# Patient Record
Sex: Female | Born: 1990 | Race: Black or African American | Hispanic: No | Marital: Single | Smoking: Never smoker
Health system: Southern US, Community
[De-identification: ages and names within clinical notes are randomized; demographics above are authoritative.]

## PROBLEM LIST (undated history)

## (undated) ENCOUNTER — Inpatient Hospital Stay: Payer: Self-pay

## (undated) DIAGNOSIS — G43909 Migraine, unspecified, not intractable, without status migrainosus: Secondary | ICD-10-CM

## (undated) DIAGNOSIS — I4430 Unspecified atrioventricular block: Secondary | ICD-10-CM

## (undated) HISTORY — PX: WISDOM TOOTH EXTRACTION: SHX21

---

## 2013-07-12 ENCOUNTER — Emergency Department (HOSPITAL_COMMUNITY): Payer: Self-pay

## 2013-07-12 ENCOUNTER — Encounter (HOSPITAL_COMMUNITY): Payer: Self-pay | Admitting: Emergency Medicine

## 2013-07-12 ENCOUNTER — Emergency Department (HOSPITAL_COMMUNITY)
Admission: EM | Admit: 2013-07-12 | Discharge: 2013-07-12 | Disposition: A | Discharge status: Home / Self Care | Payer: Self-pay | Attending: Emergency Medicine | Admitting: Emergency Medicine

## 2013-07-12 DIAGNOSIS — R109 Unspecified abdominal pain: Secondary | ICD-10-CM | POA: Insufficient documentation

## 2013-07-12 DIAGNOSIS — H53459 Other localized visual field defect, unspecified eye: Secondary | ICD-10-CM | POA: Insufficient documentation

## 2013-07-12 DIAGNOSIS — R350 Frequency of micturition: Secondary | ICD-10-CM | POA: Insufficient documentation

## 2013-07-12 DIAGNOSIS — O219 Vomiting of pregnancy, unspecified: Secondary | ICD-10-CM | POA: Insufficient documentation

## 2013-07-12 DIAGNOSIS — N390 Urinary tract infection, site not specified: Secondary | ICD-10-CM

## 2013-07-12 DIAGNOSIS — O9989 Other specified diseases and conditions complicating pregnancy, childbirth and the puerperium: Secondary | ICD-10-CM | POA: Insufficient documentation

## 2013-07-12 DIAGNOSIS — R197 Diarrhea, unspecified: Secondary | ICD-10-CM | POA: Insufficient documentation

## 2013-07-12 DIAGNOSIS — Z3490 Encounter for supervision of normal pregnancy, unspecified, unspecified trimester: Secondary | ICD-10-CM

## 2013-07-12 DIAGNOSIS — Z3201 Encounter for pregnancy test, result positive: Secondary | ICD-10-CM | POA: Insufficient documentation

## 2013-07-12 DIAGNOSIS — R110 Nausea: Secondary | ICD-10-CM | POA: Insufficient documentation

## 2013-07-12 DIAGNOSIS — R51 Headache: Secondary | ICD-10-CM | POA: Insufficient documentation

## 2013-07-12 DIAGNOSIS — R519 Headache, unspecified: Secondary | ICD-10-CM

## 2013-07-12 DIAGNOSIS — O2390 Unspecified genitourinary tract infection in pregnancy, unspecified trimester: Secondary | ICD-10-CM | POA: Insufficient documentation

## 2013-07-12 HISTORY — DX: Migraine, unspecified, not intractable, without status migrainosus: G43.909

## 2013-07-12 LAB — CBC WITH DIFFERENTIAL/PLATELET
Basophils Absolute: 0 10*3/uL (ref 0.0–0.1)
Basophils Relative: 0 % (ref 0–1)
Eosinophils Absolute: 0 10*3/uL (ref 0.0–0.7)
Lymphocytes Relative: 36 % (ref 12–46)
MCH: 24.8 pg — ABNORMAL LOW (ref 26.0–34.0)
MCHC: 34.2 g/dL (ref 30.0–36.0)
Monocytes Absolute: 0.4 10*3/uL (ref 0.1–1.0)
Neutro Abs: 3.2 10*3/uL (ref 1.7–7.7)
Neutrophils Relative %: 57 % (ref 43–77)
RDW: 14 % (ref 11.5–15.5)

## 2013-07-12 LAB — URINALYSIS, ROUTINE W REFLEX MICROSCOPIC
Ketones, ur: >80 mg/dL — AB
Protein, ur: NEGATIVE mg/dL
Urobilinogen, UA: 1 mg/dL (ref 0.0–1.0)

## 2013-07-12 LAB — URINE MICROSCOPIC-ADD ON

## 2013-07-12 LAB — BASIC METABOLIC PANEL
BUN: 7 mg/dL (ref 6–23)
Calcium: 9.6 mg/dL (ref 8.4–10.5)
GFR calc Af Amer: >90 mL/min (ref >90)
GFR calc non Af Amer: >90 mL/min (ref >90)
Glucose, Bld: 69 mg/dL — ABNORMAL LOW (ref 70–99)
Potassium: 3.1 mEq/L — ABNORMAL LOW (ref 3.5–5.1)
Sodium: 136 mEq/L (ref 135–145)

## 2013-07-12 LAB — WET PREP, GENITAL: Trich, Wet Prep: NONE SEEN

## 2013-07-12 LAB — POCT PREGNANCY, URINE: Preg Test, Ur: POSITIVE — AB

## 2013-07-12 MED ORDER — FENTANYL CITRATE 0.05 MG/ML IJ SOLN: 50.0000 ug | Freq: Once | INTRAMUSCULAR | Status: DC

## 2013-07-12 MED ORDER — SODIUM CHLORIDE 0.9 % IV BOLUS (SEPSIS)
1000.0000 mL | Freq: Once | INTRAVENOUS | Status: AC
  Administered 2013-07-12: 1000 mL via INTRAVENOUS

## 2013-07-12 MED ORDER — ONDANSETRON HCL 4 MG PO TABS: 4.0000 mg | ORAL_TABLET | Freq: Three times a day (TID) | ORAL | Status: DC | PRN

## 2013-07-12 MED ORDER — METOCLOPRAMIDE HCL 5 MG/ML IJ SOLN
10.0000 mg | Freq: Once | INTRAMUSCULAR | Status: DC
  Filled 2013-07-12: qty 2

## 2013-07-12 MED ORDER — ONDANSETRON HCL 4 MG/2ML IJ SOLN
4.0000 mg | Freq: Once | INTRAMUSCULAR | Status: AC
  Administered 2013-07-12: 4 mg via INTRAVENOUS
  Filled 2013-07-12: qty 2

## 2013-07-12 MED ORDER — DEXAMETHASONE SODIUM PHOSPHATE 10 MG/ML IJ SOLN
10.0000 mg | Freq: Once | INTRAMUSCULAR | Status: DC
  Filled 2013-07-12: qty 1

## 2013-07-12 MED ORDER — ONDANSETRON HCL 4 MG/2ML IJ SOLN
4.0000 mg | Freq: Once | INTRAMUSCULAR | Status: DC
  Filled 2013-07-12: qty 2

## 2013-07-12 MED ORDER — FENTANYL CITRATE 0.05 MG/ML IJ SOLN
50.0000 ug | Freq: Once | INTRAMUSCULAR | Status: AC
  Administered 2013-07-12: 50 ug via INTRAVENOUS
  Filled 2013-07-12: qty 2

## 2013-07-12 MED ORDER — CEPHALEXIN 500 MG PO CAPS: 500.0000 mg | ORAL_CAPSULE | Freq: Four times a day (QID) | ORAL | Status: DC

## 2013-07-12 NOTE — ED Provider Notes (Signed)
CSN: [id number]     Arrival date & time 07/12/13  0735 History     First MD Initiated Contact with Patient 07/12/13 [phone]     Chief Complaint  Patient presents with  . Headache  . Nausea   (Consider location/radiation/quality/duration/timing/severity/associated sxs/prior Treatment) Patient is a 22 y.o. female presenting with headaches.  Headache  Pt with history of migraines reports 3 days of diffuse throbbing headache, nausea, vomiting and diarrhea. Unsure if headache or vomiting started first. Symptoms were not sudden in onset and similar to previous migraines. She states during the night she also began having scotoma. No fever no neck stiffness. She reports some increased urinary frequency but no dysuria or hematuria. She has had a mild R pelvic discomfort ongoing for about a month, does not think she is pregnant, but has irregular menses. No vaginal bleeding or discharge.   No past medical history on file. No past surgical history on file. No family history on file. History  Substance Use Topics  . Smoking status: Not on file  . Smokeless tobacco: Not on file  . Alcohol Use: Not on file   OB History   No data available     Review of Systems  Neurological: Positive for headaches.   All other systems reviewed and are negative except as noted in HPI.   Allergies  Review of patient's allergies indicates not on file.  Home Medications  No current outpatient prescriptions on file. BP 143/87  Pulse 79  Temp(Src) 98.3 F (36.8 C) (Oral)  Resp 16  SpO2 100% Physical Exam  Nursing note and vitals reviewed. Constitutional: She is oriented to person, place, and time. She appears well-developed and well-nourished.  HENT:  Head: Normocephalic and atraumatic.  Eyes: EOM are normal. Pupils are equal, round, and reactive to light.  Neck: Normal range of motion. Neck supple.  Cardiovascular: Normal rate, normal heart sounds and intact distal pulses.   Pulmonary/Chest: Effort  normal and breath sounds normal.  Abdominal: Bowel sounds are normal. She exhibits no distension. There is tenderness (mild RLQ/pelvis tenderness). There is no rebound and no guarding.  Musculoskeletal: Normal range of motion. She exhibits no edema and no tenderness.  Neurological: She is alert and oriented to person, place, and time. She has normal strength. No cranial nerve deficit or sensory deficit.  Skin: Skin is warm and dry. No rash noted.  Psychiatric: She has a normal mood and affect.    ED Course   Procedures (including critical care time)  Labs Reviewed  WET PREP, GENITAL - Abnormal; Notable for the following:    Clue Cells Wet Prep HPF POC FEW (*)    WBC, Wet Prep HPF POC FEW (*)    All other components within normal limits  URINALYSIS, ROUTINE W REFLEX MICROSCOPIC - Abnormal; Notable for the following:    APPearance HAZY (*)    Ketones, ur >80 (*)    Leukocytes, UA MODERATE (*)    All other components within normal limits  CBC WITH DIFFERENTIAL - Abnormal; Notable for the following:    Hemoglobin 11.7 (*)    HCT 34.2 (*)    MCV 72.5 (*)    MCH 24.8 (*)    All other components within normal limits  BASIC METABOLIC PANEL - Abnormal; Notable for the following:    Potassium 3.1 (*)    Glucose, Bld 69 (*)    All other components within normal limits  URINE MICROSCOPIC-ADD ON - Abnormal; Notable for the following:  Squamous Epithelial / LPF FEW (*)    Bacteria, UA FEW (*)    All other components within normal limits  POCT PREGNANCY, URINE - Abnormal; Notable for the following:    Preg Test, Ur POSITIVE (*)    All other components within normal limits  GC/CHLAMYDIA PROBE AMP  URINE CULTURE  HCG, QUANTITATIVE, PREGNANCY   US Ob Transvaginal  07/12/2013   *RADIOLOGY REPORT*  Clinical Data:  Pregnancy.  Right pelvic pain.  By LMP, the patient would be 10 weeks 6 days.  EDC by LMP would be 02/01/2014.  LIMITED OBSTETRIC ULTRASOUND  Number of Fetuses: 1 Heart Rate:  135bpm Movement:  Present Presentation: Cephalic Placental Location: Fundal Previa: None Amniotic Fluid (Subjective): Normal Vertical pocket:  5.3cm  BPD:  4.14cm   18w    4d       EDC: 12/09/2013  MATERNAL FINDINGS: Cervix:  Not well seen Uterus/Adnexae:  The right ovary is not visualized.  Left ovary is normal appearance, 2.9 x 1.8 x 1.7 cm.  IMPRESSION:  1.  Single living intrauterine fetus in cephalic presentation. 2.  Size by ultrasound correlates with dating of 18 weeks 4 days. EDC by today's exam would be 12/09/2013.  This exam is performed on an emergent basis and does not comprehensively evaluate fetal size, dating, or anatomy; a follow- up complete OB US should be considered if further fetal assessment is warranted.   Original Report Authenticated By: Norva Pavlov, M.D.   1. Pregnancy   2. Headache   3. UTI (urinary tract infection)     MDM  Pt with positive pregnancy. No previous pregnancies, no prior STD treatment. Pelvic exam: mild white discharge, unable to tolerate speculum exam, bimanual tender on R adnexa. Send for [place] to rule out ectopic.   10:39 AM HCG quant sent to [facility] for analysis however US shows [redacted]wk gestation IUP. UA concerning for UTI, will start on Keflex, will need close Ob follow-up to establish for prenatal care. HA and nausea resolved.   Charles B. Bernette Mayers, MD 07/12/13 1042

## 2013-07-12 NOTE — ED Notes (Signed)
Pt returned to the ED from US

## 2013-07-12 NOTE — ED Notes (Signed)
MD at bedside. 

## 2013-07-12 NOTE — ED Notes (Signed)
Patient transported to Ultrasound 

## 2013-07-12 NOTE — ED Notes (Signed)
Headache (pain: 9/10) started 2 days ago and has persisted.  Pt says that normally headaches usually go away by now.  Pt has been nauseated for the same amount of time and also complains of visual changes such as seeing spots and squiggly things.  C/o RLQ pain (pain 6/10) that has been intermittent and going on for about a month.

## 2013-07-12 NOTE — ED Notes (Signed)
MD at bedside performing pelvic exam.

## 2013-07-13 LAB — GC/CHLAMYDIA PROBE AMP
CT Probe RNA: POSITIVE — AB
GC Probe RNA: NEGATIVE

## 2013-07-14 LAB — URINE CULTURE

## 2013-07-14 NOTE — ED Notes (Signed)
Chart sent to EDP office for review.+ Chlamydia 

## 2013-07-15 ENCOUNTER — Telehealth (HOSPITAL_COMMUNITY): Payer: Self-pay | Admitting: *Deleted

## 2013-07-15 NOTE — ED Notes (Signed)
Chart returned from EDP office .With rx written for Azithromycin 1 gram po x 1 dose need to be called to pharmacy.

## 2013-07-16 ENCOUNTER — Telehealth (HOSPITAL_COMMUNITY): Payer: Self-pay | Admitting: Emergency Medicine

## 2013-07-16 NOTE — ED Notes (Signed)
Post ED Visit - Positive Culture Follow-up ° °Culture report reviewed by antimicrobial stewardship pharmacist: °[] Wes Dulaney, Pharm.D., BCPS °[x] Jeremy Frens, Pharm.D., BCPS °[] Elizabeth Martin, Pharm.D., BCPS °[] Minh Pham, Pharm.D., BCPS, AAHIVP °[] Michelle Turner, Pharm.D., BCPS, AAHIVP ° °Positive urine culture °Treated with Keflex, organism sensitive to the same and no further patient follow-up is required at this time. ° °Lindsey Morgan °07/16/2013, 10:52 AM ° ° °

## 2014-12-01 NOTE — L&D Delivery Note (Addendum)
Obstetrical Delivery Note   Date of Delivery:   04/28/2015 Primary OB:   [facility] Gestational Age/EDD: 40/4 weeks (dated by 21 week ultrasound) Antepartum complications: [place] trait, only two prenatal visits, +gonorrhea  Delivered By:   Cornelia Copaharlie Juleen Sorrels, Jr MD  Delivery Type:   spontaneous vaginal delivery  Procedure Details:   CTSP for crowning. Over one UC, patient delivered infant, DOA, without issue. Delayed CC and cord cut by FOB. Placenta out via active third stage (intact). Perineum intact with one small divet that was bleeding and fixed with 3-0 figure of eight vicryl suture x 1.  Anesthesia:    epidural Intrapartum complications: None GBS:    Negative Laceration:    none Episiotomy:    none Placenta:    Via active 3rd stage. Intact: yes. To pathology: no.  Estimated Blood Loss:  250mL  Baby:    Liveborn female, Apgars 8/9, weight 2829gm  Cornelia Copaharlie Wayde Gopaul, Jr. MD [facility] Pager [phone]

## 2014-12-13 ENCOUNTER — Emergency Department (HOSPITAL_COMMUNITY)
Admission: EM | Admit: 2014-12-13 | Discharge: 2014-12-13 | Disposition: A | Discharge status: Home / Self Care | Payer: Self-pay | Attending: Emergency Medicine | Admitting: Emergency Medicine

## 2014-12-13 ENCOUNTER — Encounter (HOSPITAL_COMMUNITY): Payer: Self-pay | Admitting: *Deleted

## 2014-12-13 ENCOUNTER — Emergency Department (HOSPITAL_COMMUNITY): Payer: Self-pay

## 2014-12-13 DIAGNOSIS — M7989 Other specified soft tissue disorders: Secondary | ICD-10-CM | POA: Insufficient documentation

## 2014-12-13 DIAGNOSIS — O9989 Other specified diseases and conditions complicating pregnancy, childbirth and the puerperium: Secondary | ICD-10-CM | POA: Insufficient documentation

## 2014-12-13 DIAGNOSIS — R42 Dizziness and giddiness: Secondary | ICD-10-CM | POA: Insufficient documentation

## 2014-12-13 DIAGNOSIS — O209 Hemorrhage in early pregnancy, unspecified: Secondary | ICD-10-CM | POA: Insufficient documentation

## 2014-12-13 DIAGNOSIS — Z3A16 16 weeks gestation of pregnancy: Secondary | ICD-10-CM | POA: Insufficient documentation

## 2014-12-13 DIAGNOSIS — Z8679 Personal history of other diseases of the circulatory system: Secondary | ICD-10-CM | POA: Insufficient documentation

## 2014-12-13 DIAGNOSIS — N939 Abnormal uterine and vaginal bleeding, unspecified: Secondary | ICD-10-CM

## 2014-12-13 DIAGNOSIS — R51 Headache: Secondary | ICD-10-CM | POA: Insufficient documentation

## 2014-12-13 DIAGNOSIS — Z3490 Encounter for supervision of normal pregnancy, unspecified, unspecified trimester: Secondary | ICD-10-CM

## 2014-12-13 LAB — CBC WITH DIFFERENTIAL/PLATELET
BASOS PCT: 0 % (ref 0–1)
Basophils Absolute: 0 10*3/uL (ref 0.0–0.1)
EOS ABS: 0.1 10*3/uL (ref 0.0–0.7)
EOS PCT: 2 % (ref 0–5)
HEMATOCRIT: 30.8 % — AB (ref 36.0–46.0)
Hemoglobin: 10.6 g/dL — ABNORMAL LOW (ref 12.0–15.0)
LYMPHS PCT: 37 % (ref 12–46)
Lymphs Abs: 1.7 10*3/uL (ref 0.7–4.0)
MCH: 25.5 pg — ABNORMAL LOW (ref 26.0–34.0)
MCHC: 34.4 g/dL (ref 30.0–36.0)
MCV: 74.2 fL — AB (ref 78.0–100.0)
MONO ABS: 0.3 10*3/uL (ref 0.1–1.0)
MONOS PCT: 7 % (ref 3–12)
NEUTROS ABS: 2.6 10*3/uL (ref 1.7–7.7)
Neutrophils Relative %: 54 % (ref 43–77)
PLATELETS: 190 10*3/uL (ref 150–400)
RBC: 4.15 MIL/uL (ref 3.87–5.11)
RDW: 13.9 % (ref 11.5–15.5)
WBC: 4.7 10*3/uL (ref 4.0–10.5)

## 2014-12-13 LAB — BASIC METABOLIC PANEL
ANION GAP: 8 (ref 5–15)
BUN: 6 mg/dL (ref 6–23)
CALCIUM: 8.4 mg/dL (ref 8.4–10.5)
CHLORIDE: 103 meq/L (ref 96–112)
CO2: 23 mmol/L (ref 19–32)
CREATININE: 0.67 mg/dL (ref 0.50–1.10)
GFR calc non Af Amer: >90 mL/min (ref >90)
Glucose, Bld: 76 mg/dL (ref 70–99)
Potassium: 3.5 mmol/L (ref 3.5–5.1)
SODIUM: 134 mmol/L — AB (ref 135–145)

## 2014-12-13 LAB — ABO/RH: ABO/RH(D): O POS

## 2014-12-13 LAB — PREGNANCY, URINE: PREG TEST UR: POSITIVE — AB

## 2014-12-13 LAB — HCG, QUANTITATIVE, PREGNANCY: HCG, BETA CHAIN, QUANT, S: 22428 m[IU]/mL — AB (ref <5)

## 2014-12-13 MED ORDER — PRENATAL VITAMINS 28-0.8 MG PO TABS: 1.0000 | ORAL_TABLET | Freq: Every day | ORAL | Status: DC

## 2014-12-13 NOTE — ED Provider Notes (Addendum)
CSN: [id number]     Arrival date & time 12/13/14  6578 History   First MD Initiated Contact with Patient 12/13/14 [phone]     Chief Complaint  Patient presents with  . Vaginal Bleeding  . Headache     (Consider location/radiation/quality/duration/timing/severity/associated sxs/prior Treatment) Patient is a 24 y.o. female presenting with vaginal bleeding and headaches. The history is provided by the patient.  Vaginal Bleeding Associated symptoms: abdominal pain and dizziness   Associated symptoms: no back pain, no dysuria and no fever   Headache Associated symptoms: abdominal pain and dizziness   Associated symptoms: no back pain, no congestion and no fever    patient with onset of vaginal bleeding last night. Has been heavy at times. Patient's last menstrual period was 08/28/2014 however she was on Depo-Provera until August. Patient also with complaint of some swelling to both legs and hands making her suspicious that she may be pregnant. Patient with mild headache and dizziness. The vaginal bleeding associated with some mild abdominal cramping. Patient's in no acute distress.  Past Medical History  Diagnosis Date  . Migraines    History reviewed. No pertinent past surgical history. History reviewed. No pertinent family history. History  Substance Use Topics  . Smoking status: Never Smoker   . Smokeless tobacco: Not on file  . Alcohol Use: No   OB History    No data available     Review of Systems  Constitutional: Negative for fever.  HENT: Negative for congestion.   Eyes: Negative for redness.  Respiratory: Negative for shortness of breath.   Cardiovascular: Positive for leg swelling. Negative for chest pain.  Gastrointestinal: Positive for abdominal pain.  Genitourinary: Positive for vaginal bleeding. Negative for dysuria.  Musculoskeletal: Negative for back pain.  Skin: Negative for rash.  Allergic/Immunologic: Negative for immunocompromised state.  Neurological:  Positive for dizziness and headaches.  Hematological: Does not bruise/bleed easily.  Psychiatric/Behavioral: Negative for confusion.      Allergies  Review of patient's allergies indicates no known allergies.  Home Medications   Prior to Admission medications   Medication Sig Start Date End Date Taking? Authorizing Provider  ibuprofen (ADVIL,MOTRIN) 400 MG tablet Take 800 mg by mouth every 6 (six) hours as needed for mild pain.   Yes Historical Provider, MD  cephALEXin (KEFLEX) 500 MG capsule Take 1 capsule (500 mg total) by mouth 4 (four) times daily. Patient not taking: Reported on 12/13/2014 07/12/13   Charles B. Bernette Mayers, MD  ondansetron (ZOFRAN) 4 MG tablet Take 1 tablet (4 mg total) by mouth every 8 (eight) hours as needed for nausea. Patient not taking: Reported on 12/13/2014 07/12/13   Charles B. Bernette Mayers, MD  Prenatal Vit-Fe Fumarate-FA (PRENATAL VITAMINS) 28-0.8 MG TABS Take 1 tablet by mouth daily. 12/13/14   Vanetta Mulders, MD   BP 114/48 mmHg  Pulse 85  Temp(Src) 98.3 F (36.8 C) (Oral)  Resp 16  SpO2 99%  LMP 08/01/2014 Physical Exam  Constitutional: She is oriented to person, place, and time. She appears well-developed and well-nourished. No distress.  HENT:  Head: Normocephalic and atraumatic.  Mouth/Throat: Oropharynx is clear and moist.  Eyes: Conjunctivae and EOM are normal. Pupils are equal, round, and reactive to light.  Neck: Normal range of motion.  Cardiovascular: Normal rate, regular rhythm and normal heart sounds.   Pulmonary/Chest: Effort normal and breath sounds normal.  Abdominal: Soft. Bowel sounds are normal. There is no tenderness.  Questionable fullness to the uterus.  Musculoskeletal: Normal range of motion.  Neurological: She is alert and oriented to person, place, and time. No cranial nerve deficit. She exhibits normal muscle tone. Coordination normal.  Skin: Skin is warm. No rash noted.  Nursing note and vitals reviewed.   ED Course   Procedures (including critical care time) Labs Review Labs Reviewed  BASIC METABOLIC PANEL - Abnormal; Notable for the following:    Sodium 134 (*)    All other components within normal limits  CBC WITH DIFFERENTIAL - Abnormal; Notable for the following:    Hemoglobin 10.6 (*)    HCT 30.8 (*)    MCV 74.2 (*)    MCH 25.5 (*)    All other components within normal limits  PREGNANCY, URINE - Abnormal; Notable for the following:    Preg Test, Ur POSITIVE (*)    All other components within normal limits  HCG, QUANTITATIVE, PREGNANCY - Abnormal; Notable for the following:    hCG, Beta Chain, Quant, S 22428 (*)    All other components within normal limits  ABO/RH   Results for orders placed or performed during the hospital encounter of 12/13/14  Basic metabolic panel  Result Value Ref Range   Sodium 134 (L) 135 - 145 mmol/L   Potassium 3.5 3.5 - 5.1 mmol/L   Chloride 103 96 - 112 mEq/L   CO2 23 19 - 32 mmol/L   Glucose, Bld 76 70 - 99 mg/dL   BUN 6 6 - 23 mg/dL   Creatinine, Ser 4.09 0.50 - 1.10 mg/dL   Calcium 8.4 8.4 - 81.1 mg/dL   GFR calc non Af Amer >90 >90 mL/min   GFR calc Af Amer >90 >90 mL/min   Anion gap 8 5 - 15  CBC with Differential  Result Value Ref Range   WBC 4.7 4.0 - 10.5 K/uL   RBC 4.15 3.87 - 5.11 MIL/uL   Hemoglobin 10.6 (L) 12.0 - 15.0 g/dL   HCT 91.4 (L) 78.2 - 95.6 %   MCV 74.2 (L) 78.0 - 100.0 fL   MCH 25.5 (L) 26.0 - 34.0 pg   MCHC 34.4 30.0 - 36.0 g/dL   RDW 21.3 08.6 - 57.8 %   Platelets 190 150 - 400 K/uL   Neutrophils Relative % 54 43 - 77 %   Lymphocytes Relative 37 12 - 46 %   Monocytes Relative 7 3 - 12 %   Eosinophils Relative 2 0 - 5 %   Basophils Relative 0 0 - 1 %   Neutro Abs 2.6 1.7 - 7.7 K/uL   Lymphs Abs 1.7 0.7 - 4.0 K/uL   Monocytes Absolute 0.3 0.1 - 1.0 K/uL   Eosinophils Absolute 0.1 0.0 - 0.7 K/uL   Basophils Absolute 0.0 0.0 - 0.1 K/uL   Smear Review MORPHOLOGY UNREMARKABLE   Pregnancy, urine  Result Value Ref Range    Preg Test, Ur POSITIVE (A) NEGATIVE  hCG, quantitative, pregnancy  Result Value Ref Range   hCG, Beta Chain, Quant, S 22428 (H) <5 mIU/mL  ABO/Rh  Result Value Ref Range   ABO/RH(D) O POS    No rh immune globuloin NOT A RH IMMUNE GLOBULIN CANDIDATE, PT RH POSITIVE      Imaging Review US Ob Limited  12/13/2014   CLINICAL DATA:  Pregnant patient with vaginal bleeding.  EXAM: LIMITED OBSTETRIC ULTRASOUND  FINDINGS: Number of Fetuses: 1  Heart Rate:  121 bpm  Movement: Yes  Presentation: Cephalic  Placental Location: Posterior  Previa: No  Amniotic Fluid (Subjective):  Within normal  limits.  BPD:  5.0cm 21w  1d  MATERNAL FINDINGS:  Cervix:  Appears closed.  Uterus/Adnexae:  No abnormality visualized.  IMPRESSION: Single living anterior pregnancy.  No acute abnormality.  This exam is performed on an emergent basis and does not comprehensively evaluate fetal size, dating, or anatomy; follow-up complete OB US should be considered if further fetal assessment is warranted.   Electronically Signed   By: Drusilla Kannerhomas  Dalessio M.D.   On: 12/13/2014 11:03     EKG Interpretation None      MDM   Final diagnoses:  Vaginal bleeding  Pregnancy    Patient with positive pregnancy test. Last menstrual period was 08/28/2014 however patient had been on Depakote that was stopped in August. So dates are not very clear. Patient started with vaginal bleeding last night. The patient was suspicious that she possibly could be pregnant. Patient's a gravida 2 based on today's pregnancy para 0.  Quantitative hCG and ultrasound have been ordered to further evaluate the pregnancy status.  Rh factor is positive so no concerns for sensitization. Ultrasound is pending. Quantitative hCG is pending.  Ultrasound is positive for uterine pregnancy with fetal heart tones and fetal motion. Wearing ultrasound patient fairly far along. Quantitative hCG still pending. Patient be started on prenatal vitamins and referred to OB/GYN for  follow-up. Patient still understands that there is a possibility of miscarriage since been some bleeding but is always of concern. Quantitative hCG is back and can be used for comparison of a repeat hCG in the next 48 hours.  Vanetta MuldersScott Nephi Savage, MD 12/13/14 1125  Vanetta MuldersScott Danice Dippolito, MD 12/13/14 [phone]

## 2014-12-13 NOTE — Discharge Instructions (Signed)
Start taking the prenatal vitamins 1 a day. Return for any new or worse symptoms to include worse abdominal pain worse bleeding. Make an appointment to follow-up with OB/GYN. Call [phone].

## 2014-12-13 NOTE — ED Notes (Addendum)
Pt reports having a headache, dizziness, leg swelling and thinks she could possibly be pregnant but started having vaginal bleeding last night. lmp was sept, took one preg test which was negative.

## 2014-12-13 NOTE — ED Notes (Signed)
NAD at this time pt is stable and going home.

## 2015-03-16 ENCOUNTER — Emergency Department: Admit: 2015-03-16 | Disposition: A | Discharge status: Home / Self Care | Payer: Self-pay | Admitting: Student

## 2015-03-16 LAB — CBC
HCT: 32 % — ABNORMAL LOW (ref 35.0–47.0)
HGB: 10.7 g/dL — ABNORMAL LOW (ref 12.0–16.0)
MCH: 25.2 pg — ABNORMAL LOW (ref 26.0–34.0)
MCHC: 33.4 g/dL (ref 32.0–36.0)
MCV: 76 fL — ABNORMAL LOW (ref 80–100)
PLATELETS: 183 10*3/uL (ref 150–440)
RBC: 4.24 10*6/uL (ref 3.80–5.20)
RDW: 13.6 % (ref 11.5–14.5)
WBC: 10 10*3/uL (ref 3.6–11.0)

## 2015-03-16 LAB — COMPREHENSIVE METABOLIC PANEL
ALK PHOS: 63 U/L
ALT: 13 U/L — AB
AST: 23 U/L
Albumin: 2.5 g/dL — ABNORMAL LOW
Anion Gap: 5 — ABNORMAL LOW (ref 7–16)
BUN: 6 mg/dL
Bilirubin,Total: 1.1 mg/dL
CO2: 21 mmol/L — AB
Calcium, Total: 7.8 mg/dL — ABNORMAL LOW
Chloride: 104 mmol/L
Creatinine: 0.78 mg/dL
EGFR (African American): >60
Glucose: 91 mg/dL
POTASSIUM: 3.3 mmol/L — AB
SODIUM: 130 mmol/L — AB
Total Protein: 5.7 g/dL — ABNORMAL LOW

## 2015-03-16 LAB — PROTIME-INR
INR: 1
Prothrombin Time: 13.4 secs

## 2015-03-16 LAB — APTT: Activated PTT: 32.7 secs (ref 23.6–35.9)

## 2015-03-16 LAB — CK TOTAL AND CKMB (NOT AT ARMC)
CK, Total: 86 U/L
CK-MB: 0.8 ng/mL

## 2015-03-16 LAB — TROPONIN I: Troponin-I: <0.03 ng/mL

## 2015-03-17 LAB — URINALYSIS, COMPLETE
BILIRUBIN, UR: NEGATIVE
GLUCOSE, UR: NEGATIVE mg/dL (ref 0–75)
Nitrite: NEGATIVE
Ph: 8 (ref 4.5–8.0)
Protein: 30
SPECIFIC GRAVITY: 1.008 (ref 1.003–1.030)

## 2015-03-20 LAB — OB RESULTS CONSOLE ABO/RH: RH Type: NEGATIVE

## 2015-04-01 NOTE — Consult Note (Signed)
Consulting Department: Emergency Department Consulting Physician: Janalyn Harderavid Kaminski MD  Consulting Question: Pregnant no prenatal care  History of Present Illness: Lindsey Morgan 24 year old G1 at approximately 31 weeks based on ultrasound 2 weeks ago at [place].  She has had one other [facility] around 20 [facility].  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  She is unsure of her LMP as she conceived on Depo Provera, last shot June or May 2015, this is also the reason for her late presentation.  She presents to the ER today with subjective dyspnea, was oxygenating well but was tachycardic on presentation.  Work up negative for PE, MI.  She was noted to have a UTI which she will be discharge with macrobid.  Feels better after receiving some fluids here in the ER. history of varicellain healthcare setting so possible TB exposure in past but negative testingcatsclose contact with children family history of birth defectsfamily history of genetic diseases including TaySachs, SMA, huntington's, muscular dystrophy, cystic fibrosis.  She does believe she is a sickle cell carrierfamily history of autism or mental retardationfrench canadian, jewish, middle [place], or asian ancestry  Review of systems: 10 point review of systems negative unless otherwise noted in HPI  Past Medical History: none  Past Surgical History: none  Obstetric History: G1  Past Gynecologic History: no history of abnormal paps, history of uncomplicated chlamydia cervicitis in the past  Family History: non-contributory  Social History:  Denies tobacco, EtOH, or illicit drug use  Allergies: NKDA  Medications:  OTC PNV  Phsyical Exam:99.6, BP 144/63, HR 138, RR 20, O2sat 98% RA NADNormocephalic anictericno thryomegaly or massesRRRCTABGravid, soft, mildly tender suprapubiclly Deferredno edemaA&Ox3, no gross neurological deficits  Labs: CBC, CMP, cardiac enzymes, and  UA reviewed.  Imaging: negative PE CT  Assessment: 24 year old G1 at approximately 31 weeks no prenatal care and UTI  PlanReceived one dose of ciprofloxacin in ER (experimental animal models show effect on fetal cartilage but not adverse effects documented during human pregnancy), discharged on macrobidwill arrange follow up with myself on 03/19/15 at 0800 [place] officecontinue prental vitaminswill set up anatomy scan, 1-hr OGTT in the next week at Mondays visit  Electronic Signatures: Lorrene ReidStaebler, Raylin Winer M (MD)  (Signed on 16-Apr-16 02:59)  Authored  Last Updated: 16-Apr-16 02:59 by Lorrene ReidStaebler, Saksham Akkerman M (MD)

## 2015-04-02 LAB — OB RESULTS CONSOLE GBS
GBS: NEGATIVE
GBS: NEGATIVE

## 2015-04-27 ENCOUNTER — Inpatient Hospital Stay: Payer: 59 | Admitting: Anesthesiology

## 2015-04-27 ENCOUNTER — Inpatient Hospital Stay
Admission: EM | Admit: 2015-04-27 | Discharge: 2015-04-29 | DRG: 951 | Disposition: A | Discharge status: Home / Self Care | Payer: 59 | Attending: Obstetrics and Gynecology | Admitting: Obstetrics and Gynecology

## 2015-04-27 DIAGNOSIS — Z79899 Other long term (current) drug therapy: Secondary | ICD-10-CM | POA: Diagnosis present

## 2015-04-27 DIAGNOSIS — O0933 Supervision of pregnancy with insufficient antenatal care, third trimester: Secondary | ICD-10-CM | POA: Diagnosis present

## 2015-04-27 DIAGNOSIS — Z3493 Encounter for supervision of normal pregnancy, unspecified, third trimester: Secondary | ICD-10-CM

## 2015-04-27 DIAGNOSIS — Z3A40 40 weeks gestation of pregnancy: Secondary | ICD-10-CM | POA: Diagnosis present

## 2015-04-27 LAB — CBC
HCT: 36.7 % (ref 35.0–47.0)
HEMOGLOBIN: 11.8 g/dL — AB (ref 12.0–16.0)
MCH: 23.9 pg — ABNORMAL LOW (ref 26.0–34.0)
MCHC: 32.2 g/dL (ref 32.0–36.0)
MCV: 74.3 fL — AB (ref 80.0–100.0)
PLATELETS: 232 10*3/uL (ref 150–440)
RBC: 4.94 MIL/uL (ref 3.80–5.20)
RDW: 14.4 % (ref 11.5–14.5)
WBC: 6.1 10*3/uL (ref 3.6–11.0)

## 2015-04-27 LAB — TYPE AND SCREEN
ABO/RH(D): O POS
ANTIBODY SCREEN: NEGATIVE

## 2015-04-27 LAB — URINE DRUG SCREEN, QUALITATIVE (ARMC ONLY)
Amphetamines, Ur Screen: NOT DETECTED
BENZODIAZEPINE, UR SCRN: NOT DETECTED
Barbiturates, Ur Screen: NOT DETECTED
COCAINE METABOLITE, UR ~~LOC~~: NOT DETECTED
Cannabinoid 50 Ng, Ur ~~LOC~~: NOT DETECTED
MDMA (Ecstasy)Ur Screen: NOT DETECTED
Methadone Scn, Ur: NOT DETECTED
Opiate, Ur Screen: NOT DETECTED
Phencyclidine (PCP) Ur S: NOT DETECTED
Tricyclic, Ur Screen: NOT DETECTED

## 2015-04-27 MED ORDER — LIDOCAINE HCL (PF) 1 % IJ SOLN
30.0000 mL | INTRAMUSCULAR | Status: DC | PRN
  Filled 2015-04-27: qty 30

## 2015-04-27 MED ORDER — BUPIVACAINE HCL (PF) 0.25 % IJ SOLN
INTRAMUSCULAR | Status: DC | PRN
  Administered 2015-04-27: 2 mL
  Administered 2015-04-27: 5 mL via PERINEURAL

## 2015-04-27 MED ORDER — FENTANYL CITRATE (PF) 100 MCG/2ML IJ SOLN: 50.0000 ug | INTRAMUSCULAR | Status: DC | PRN

## 2015-04-27 MED ORDER — OXYTOCIN 40 UNITS IN LACTATED RINGERS INFUSION - SIMPLE MED
INTRAVENOUS | Status: AC
  Filled 2015-04-27: qty 1000

## 2015-04-27 MED ORDER — LACTATED RINGERS IV SOLN
INTRAVENOUS | Status: DC
  Administered 2015-04-27 (×2): via INTRAVENOUS

## 2015-04-27 MED ORDER — ONDANSETRON HCL 4 MG/2ML IJ SOLN: 4.0000 mg | Freq: Four times a day (QID) | INTRAMUSCULAR | Status: DC | PRN

## 2015-04-27 MED ORDER — OXYTOCIN BOLUS FROM INFUSION: 500.0000 mL | INTRAVENOUS | Status: DC

## 2015-04-27 MED ORDER — AMMONIA AROMATIC IN INHA
RESPIRATORY_TRACT | Status: AC
  Filled 2015-04-27: qty 10

## 2015-04-27 MED ORDER — LIDOCAINE-EPINEPHRINE (PF) 1.5 %-1:200000 IJ SOLN
INTRAMUSCULAR | Status: DC | PRN
  Administered 2015-04-27: 2 mL via PERINEURAL

## 2015-04-27 MED ORDER — LACTATED RINGERS IV SOLN: 500.0000 mL | INTRAVENOUS | Status: DC | PRN

## 2015-04-27 MED ORDER — OXYTOCIN 40 UNITS IN LACTATED RINGERS INFUSION - SIMPLE MED: 62.5000 mL/h | INTRAVENOUS | Status: DC

## 2015-04-27 MED ORDER — ACETAMINOPHEN 325 MG PO TABS: 650.0000 mg | ORAL_TABLET | ORAL | Status: DC | PRN

## 2015-04-27 MED ORDER — FENTANYL 2.5 MCG/ML W/ROPIVACAINE 0.2% IN NS 100 ML EPIDURAL INFUSION (ARMC-ANES)
EPIDURAL | Status: AC
  Administered 2015-04-27: 9 mL/h via EPIDURAL
  Administered 2015-04-28
  Filled 2015-04-27: qty 100

## 2015-04-27 MED ORDER — MISOPROSTOL 200 MCG PO TABS
ORAL_TABLET | ORAL | Status: AC
  Filled 2015-04-27: qty 4

## 2015-04-27 MED ORDER — LIDOCAINE HCL (PF) 1 % IJ SOLN
INTRAMUSCULAR | Status: AC
  Filled 2015-04-27: qty 30

## 2015-04-27 NOTE — Anesthesia Preprocedure Evaluation (Signed)
Anesthesia Evaluation  Patient identified by MRN, date of birth, ID band Patient awake    Reviewed: Allergy & Precautions, NPO status , Patient's Chart, lab work & pertinent test results  History of Anesthesia Complications Negative for: history of anesthetic complications  Airway Mallampati: I       Dental no notable dental hx. (+) Teeth Intact   Pulmonary neg pulmonary ROS,    Pulmonary exam normal       Cardiovascular negative cardio ROS Normal cardiovascular exam    Neuro/Psych negative neurological ROS  negative psych ROS   GI/Hepatic negative GI ROS, Neg liver ROS,   Endo/Other  negative endocrine ROS  Renal/GU negative Renal ROS  negative genitourinary   Musculoskeletal negative musculoskeletal ROS (+)   Abdominal Normal abdominal exam  (+)   Peds negative pediatric ROS (+)  Hematology negative hematology ROS (+)   Anesthesia Other Findings   Reproductive/Obstetrics negative OB ROS                             Anesthesia Physical Anesthesia Plan  ASA: II  Anesthesia Plan: Epidural   Post-op Pain Management:    Induction:   Airway Management Planned:   Additional Equipment:   Intra-op Plan:   Post-operative Plan:   Informed Consent: I have reviewed the patients History and Physical, chart, labs and discussed the procedure including the risks, benefits and alternatives for the proposed anesthesia with the patient or authorized representative who has indicated his/her understanding and acceptance.     Plan Discussed with:   Anesthesia Plan Comments:         Anesthesia Quick Evaluation

## 2015-04-27 NOTE — Progress Notes (Signed)
Pt has been consented.  Sitting up at bedside. Dr. Sherron MondayVanstaferen at bedside.

## 2015-04-27 NOTE — H&P (Addendum)
Obstetrics Admission History & Physical  Primary OBGYN: [facility]  Chief Complaint: regular UCs  History of Present Illness  24 y.o. [id number] @ 40/3 (EDC 5/24, dated by 21wk u/s). Pregnancy complicated by: only two PNVs (4/18 and 5/2), [place] trait, +GC (treated on 5/2).  No VB or decreased FM; ?LOF   Review of Systems: Otherwise, her 12 point review of systems is negative or as noted in the History of Present Illness.   PMHx: History reviewed. No pertinent past medical history. PSHx: History reviewed. No pertinent past surgical history. Medications:  Prescriptions prior to admission  Medication Sig Dispense Refill Last Dose  . Prenatal Vit-Fe Fumarate-FA (PRENATAL MULTIVITAMIN) TABS tablet Take 1 tablet by mouth daily at 12 noon.       Allergies: has No Known Allergies. OBHx: as above            FHx: History reviewed. No pertinent family history. Soc Hx:  History   Social History  . Marital Status: Unknown    Spouse Name: N/A  . Number of Children: N/A  . Years of Education: N/A   Occupational History  . Not on file.   Social History Main Topics  . Smoking status: Never Smoker   . Smokeless tobacco: Not on file  . Alcohol Use: No  . Drug Use: No  . Sexual Activity: Yes   Other Topics Concern  . Not on file   Social History Narrative  . No narrative on file    Objective    Current Vital Signs 24h Vital Sign Ranges  T 98.3 F (36.8 C) Temp  Avg: 98.3 F (36.8 C)  Min: 98.3 F (36.8 C)  Max: 98.3 F (36.8 C)  BP 135/83 mmHg BP  Min: 135/83  Max: 135/83  HR 68 Pulse  Avg: 68  Min: 68  Max: 68  RR 18 Resp  Avg: 18  Min: 18  Max: 18  SaO2     No Data Recorded       24 Hour I/O Current Shift I/O  Time Ins Outs       EFM: 135 baseline, +accels, no decels, mod variability  Toco: q3-3164m  General: moderate distress with UCs Skin:  Warm and dry.  Cardiovascular: Regular rate and rhythm. Respiratory:  Clear to auscultation bilateral. Normal respiratory  effort Abdomen: gravid, NTTP, Neuro/Psych:  Normal mood and affect.   SVE: per RN 5/90/-2 @ 2130, intact Le[place]olds/EFW: 3000gm  Labs  none  Radiology BSUS: cephalic 5/2 @ 36wks: 2445gm, normal AFI and AC c/w 34wks. EFW not calculated due to lack of correct dates  Perinatal info  O pos/ Rubella Imm  / RPR pending/HIV neg/HepB Surf Ag neg/TDaP UTD /pap neg 2016/[facility]  Assessment & Plan   Patient doing well *IUP: category I with accels *Active labor: contracting well on her own. D/w pt augmentation if spaces out *Lack of PNC: UDS today. Prior one negative *[place] trait: FOB not tested. Let peds know *h/o GC: f/u admit GC. Let peds know *GBS: neg *Analgesia: desires epidural which is okay; IV PRNs in interim  Cornelia Copaharlie Poetry Cerro, Jr. MD [facility] Pager [phone]

## 2015-04-27 NOTE — Anesthesia Procedure Notes (Signed)
Epidural Patient location during procedure: OB Start time: 04/27/2015 11:00 PM End time: 04/27/2015 11:23 PM  Staffing Anesthesiologist: Elijio MilesVAN STAVEREN, Aviendha Azbell F  Preanesthetic Checklist Completed: patient identified, site marked, surgical consent, pre-op evaluation, timeout performed, IV checked, risks and benefits discussed, monitors and equipment checked and at surgeon's request  Epidural Patient position: sitting Prep: Betadine Patient monitoring: heart rate, continuous pulse ox and blood pressure Approach: midline Location: L3-L4 Injection technique: LOR air and LOR saline  Needle:  Needle type: Tuohy  Needle gauge: 18 G Needle length: 9 cm Needle insertion depth: 6 cm Catheter type: closed end flexible Catheter size: 20 Guage Catheter at skin depth: 8 cm Test dose: 1.5% lidocaine with Epi 1:200 K  Assessment Sensory level: T8  Additional Notes Reason for block:at surgeon's request

## 2015-04-27 NOTE — Progress Notes (Signed)
Bedside US done. Fetus is head down.

## 2015-04-28 LAB — CHLAMYDIA/NGC RT PCR (ARMC ONLY)
Chlamydia Tr: NOT DETECTED
N GONORRHOEAE: NOT DETECTED

## 2015-04-28 LAB — ABO/RH: ABO/RH(D): O POS

## 2015-04-28 MED ORDER — MEDROXYPROGESTERONE ACETATE 150 MG/ML IM SUSP
150.0000 mg | Freq: Once | INTRAMUSCULAR | Status: AC
  Administered 2015-04-29: 150 mg via INTRAMUSCULAR
  Filled 2015-04-28: qty 1

## 2015-04-28 MED ORDER — ACETAMINOPHEN 325 MG PO TABS: 650.0000 mg | ORAL_TABLET | ORAL | Status: DC | PRN

## 2015-04-28 MED ORDER — LANOLIN HYDROUS EX OINT: TOPICAL_OINTMENT | CUTANEOUS | Status: DC | PRN

## 2015-04-28 MED ORDER — MAGNESIUM HYDROXIDE 400 MG/5ML PO SUSP: 30.0000 mL | ORAL | Status: DC | PRN

## 2015-04-28 MED ORDER — ONDANSETRON HCL 4 MG PO TABS: 4.0000 mg | ORAL_TABLET | ORAL | Status: DC | PRN

## 2015-04-28 MED ORDER — BENZOCAINE-MENTHOL 20-0.5 % EX AERO: 1.0000 "application " | INHALATION_SPRAY | CUTANEOUS | Status: DC | PRN

## 2015-04-28 MED ORDER — WITCH HAZEL-GLYCERIN EX PADS: 1.0000 "application " | MEDICATED_PAD | CUTANEOUS | Status: DC | PRN

## 2015-04-28 MED ORDER — OXYCODONE-ACETAMINOPHEN 5-325 MG PO TABS: 1.0000 | ORAL_TABLET | ORAL | Status: DC | PRN

## 2015-04-28 MED ORDER — DIPHENHYDRAMINE HCL 25 MG PO CAPS: 25.0000 mg | ORAL_CAPSULE | Freq: Four times a day (QID) | ORAL | Status: DC | PRN

## 2015-04-28 MED ORDER — OXYTOCIN 40 UNITS IN LACTATED RINGERS INFUSION - SIMPLE MED: 62.5000 mL/h | INTRAVENOUS | Status: DC | PRN

## 2015-04-28 MED ORDER — ONDANSETRON HCL 4 MG/2ML IJ SOLN: 4.0000 mg | INTRAMUSCULAR | Status: DC | PRN

## 2015-04-28 MED ORDER — DIBUCAINE 1 % RE OINT: 1.0000 "application " | TOPICAL_OINTMENT | RECTAL | Status: DC | PRN

## 2015-04-28 MED ORDER — PRENATAL MULTIVITAMIN CH
1.0000 | ORAL_TABLET | Freq: Every day | ORAL | Status: DC
  Administered 2015-04-29: 1 via ORAL
  Filled 2015-04-28 (×2): qty 1

## 2015-04-28 MED ORDER — SIMETHICONE 80 MG PO CHEW: 80.0000 mg | CHEWABLE_TABLET | ORAL | Status: DC | PRN

## 2015-04-28 MED ORDER — IBUPROFEN 600 MG PO TABS
600.0000 mg | ORAL_TABLET | Freq: Four times a day (QID) | ORAL | Status: DC
  Administered 2015-04-28 – 2015-04-29 (×4): 600 mg via ORAL
  Filled 2015-04-28 (×5): qty 1

## 2015-04-28 NOTE — Progress Notes (Signed)
Pt assisted to bathroom. Shown pericare.  Clean gown and pad with ice pack on.  IV infusing. Pt assisted back to bed in stable condition to await PP room.

## 2015-04-28 NOTE — Progress Notes (Signed)
Post Partum Day 0 Subjective: no complaints, up ad lib, voiding and tolerating PO  Objective: Blood pressure 112/61, pulse 74, temperature 97.8 F (36.6 C), temperature source Oral, resp. rate 16, height 5\' 5"  (1.651 m), weight 80.74 kg (178 lb), SpO2 100 %, unknown if currently breastfeeding.  Physical Exam:  General: alert, cooperative and appears stated age Lochia: appropriate Uterine Fundus: firm Incision: none DVT Evaluation: No evidence of DVT seen on physical exam.   Recent Labs  04/27/15 2145  HGB 11.8*  HCT 36.7    Assessment/Plan: Plan for discharge tomorrow, Breastfeeding and Contraception - plans DEPO   LOS: 1 day   HARRIS,ROBERT PAUL 04/28/2015, 2:44 PM

## 2015-04-28 NOTE — Anesthesia Postprocedure Evaluation (Signed)
  Anesthesia Post-op Note  Patient: Lindsey Morgan  Procedure(s) Performed: CLE  Anesthesia type: CLE  Patient location: PACU  Post pain: Pain level controlled  Post assessment: Post-op Vital signs reviewed, Patient's Cardiovascular Status Stable, Respiratory Function Stable, Patent Airway and No signs of Nausea or vomiting  Post vital signs: Reviewed and stable  Last Vitals:  Filed Vitals:   04/28/15 0852  BP: 121/65  Pulse: 67  Temp: 36.5 C  Resp: 16    Level of consciousness: awake, alert  and patient cooperative  Complications: No apparent anesthesia complications

## 2015-04-28 NOTE — Discharge Summary (Signed)
Obstetrical Discharge Summary  Date of Admission: 04/27/2015 Date of Discharge: 04/29/2015  Primary OB: [facility]  Gestational Age at Delivery: 40/4 weeks  Antepartum complications: only two prenatal visits, [place] trait, +gonorrhea Reason for Admission: active labor Date of Delivery: 04/28/2015  Delivered By: Lindsey Morgan, Jr MD Delivery Type: spontaneous vaginal delivery Intrapartum complications/course: None Anesthesia: epidural Placenta: expressed, intact Laceration: none Episiotomy: none Baby: Liveborn female  Post partum course: Routine Disposition: Home  Rh Immune globulin given: not applicable Rubella vaccine given: no Tdap vaccine given in AP or PP setting: yes Flu vaccine given in AP or PP setting: not applicable  Contraception: Plans Depo Provera  Prenatal/Postnatal Panel: O POS//Rubella Immune////pap no abnormalities (date: 2016)//plans to breastfeed  Plan:  Lindsey Morgan was discharged to home in good condition. Follow-up appointment with [facility] in 6 weeks Call to schedule PP Appt.  Discharge Medications:   Medication List    TAKE these medications        medroxyPROGESTERone 150 MG/ML injection  Commonly known as:  DEPO-PROVERA  Inject 1 mL (150 mg total) into the muscle once.     prenatal multivitamin Tabs tablet  Take 1 tablet by mouth daily at 12 noon.

## 2015-04-29 LAB — RPR: RPR: NONREACTIVE

## 2015-04-29 MED ORDER — TETANUS-DIPHTH-ACELL PERTUSSIS 5-2.5-18.5 LF-MCG/0.5 IM SUSP: 0.5000 mL | Freq: Once | INTRAMUSCULAR | Status: DC

## 2015-04-29 MED ORDER — TETANUS-DIPHTH-ACELL PERTUSSIS 5-2.5-18.5 LF-MCG/0.5 IM SUSP
0.5000 mL | Freq: Once | INTRAMUSCULAR | Status: AC
  Administered 2015-04-29: 0.5 mL via INTRAMUSCULAR
  Filled 2015-04-29: qty 0.5

## 2015-04-29 MED ORDER — MEDROXYPROGESTERONE ACETATE 150 MG/ML IM SUSP: 150.0000 mg | Freq: Once | INTRAMUSCULAR | Status: DC

## 2015-04-29 NOTE — Plan of Care (Signed)
Problem: Phase I Progression Outcomes Goal: IS, TCDB as ordered Outcome: Not Applicable Date Met:  79/43/27 Ambulating

## 2015-04-29 NOTE — Progress Notes (Signed)
Pt. With c/o chest," Discomfort" that, " Feels Heavy". VS: 121/77,  HR 77 and regular, RR 20 and unlabored, O2 sat. Is 100% on R/A. Temp. Is 98.3. Color good, skin w&d., BBS clear. Poss. Pedal pulses equal and strong with cap. Refill < 2 sec. Pt. Relates H/O c/o this same chest discomfort appx 1 and 1/2 months ago. She was seen in L&D and Pt. States she was treated for a UTI. Dr. Tiburcio PeaHarris notified and he stated he would, " See Pt. On rounds". Will follow Pt. Closely and give full report to oncoming RN.

## 2015-04-29 NOTE — Progress Notes (Signed)
Discharge order in by Dr. Harris. Instructions reviewed with patient. Pt v/u of all instructions. Prescription given to pt. ID bands of mom and infant matched. Escorted by nursing via w/c in stable condition, infant in arms.  

## 2015-04-29 NOTE — Progress Notes (Signed)
Post Partum Day 1 Subjective: no complaints, up ad lib, voiding and tolerating PO  Objective: Blood pressure 117/53, pulse 77, temperature 98 F (36.7 C), temperature source Oral, resp. rate 18, height 5\' 5"  (1.651 m), weight 80.74 kg (178 lb), SpO2 100 %, unknown if currently breastfeeding.  Physical Exam:  General: alert, cooperative and appears stated age Lochia: appropriate Uterine Fundus: firm Incision: none DVT Evaluation: No evidence of DVT seen on physical exam.   Recent Labs  04/27/15 2145  HGB 11.8*  HCT 36.7    Assessment/Plan: Plan for discharge.  Breastfeeding and Contraception - plans DEPO   LOS: 2 days   Yorley Buch PAUL 04/29/2015, 11:06 AM

## 2015-04-29 NOTE — Discharge Instructions (Signed)

## 2016-02-07 ENCOUNTER — Emergency Department
Admission: EM | Admit: 2016-02-07 | Discharge: 2016-02-07 | Disposition: A | Discharge status: Home / Self Care | Payer: 59 | Attending: Emergency Medicine | Admitting: Emergency Medicine

## 2016-02-07 ENCOUNTER — Encounter: Payer: Self-pay | Admitting: *Deleted

## 2016-02-07 DIAGNOSIS — J069 Acute upper respiratory infection, unspecified: Secondary | ICD-10-CM | POA: Insufficient documentation

## 2016-02-07 DIAGNOSIS — Z79899 Other long term (current) drug therapy: Secondary | ICD-10-CM | POA: Insufficient documentation

## 2016-02-07 MED ORDER — PSEUDOEPH-BROMPHEN-DM 30-2-10 MG/5ML PO SYRP: 10.0000 mL | ORAL_SOLUTION | Freq: Four times a day (QID) | ORAL | Status: DC | PRN

## 2016-02-07 MED ORDER — FLUTICASONE PROPIONATE 50 MCG/ACT NA SUSP: 1.0000 | Freq: Two times a day (BID) | NASAL | Status: DC

## 2016-02-07 MED ORDER — CETIRIZINE HCL 10 MG PO TABS: 10.0000 mg | ORAL_TABLET | Freq: Every day | ORAL | Status: DC

## 2016-02-07 NOTE — ED Notes (Signed)
Discharge instructions reviewed with patient. Patient verbalized understanding. Patient ambulated to lobby without difficulty.   

## 2016-02-07 NOTE — Discharge Instructions (Signed)
Upper Respiratory Infection, Adult Most upper respiratory infections (URIs) are a viral infection of the air passages leading to the lungs. A URI affects the nose, throat, and upper air passages. The most common type of URI is nasopharyngitis and is typically referred to as "the common cold." URIs run their course and usually go away on their own. Most of the time, a URI does not require medical attention, but sometimes a bacterial infection in the upper airways can follow a viral infection. This is called a secondary infection. Sinus and middle ear infections are common types of secondary upper respiratory infections. Bacterial pneumonia can also complicate a URI. A URI can worsen asthma and chronic obstructive pulmonary disease (COPD). Sometimes, these complications can require emergency medical care and may be life threatening.  CAUSES Almost all URIs are caused by viruses. A virus is a type of germ and can spread from one person to another.  RISKS FACTORS You may be at risk for a URI if:   You smoke.   You have chronic heart or lung disease.  You have a weakened defense (immune) system.   You are very young or very old.   You have nasal allergies or asthma.  You work in crowded or poorly ventilated areas.  You work in health care facilities or schools. SIGNS AND SYMPTOMS  Symptoms typically develop 2-3 days after you come in contact with a cold virus. Most viral URIs last 7-10 days. However, viral URIs from the influenza virus (flu virus) can last 14-18 days and are typically more severe. Symptoms may include:   Runny or stuffy (congested) nose.   Sneezing.   Cough.   Sore throat.   Headache.   Fatigue.   Fever.   Loss of appetite.   Pain in your forehead, behind your eyes, and over your cheekbones (sinus pain).  Muscle aches.  DIAGNOSIS  Your health care provider may diagnose a URI by:  Physical exam.  Tests to check that your symptoms are not due to  another condition such as:  Strep throat.  Sinusitis.  Pneumonia.  Asthma. TREATMENT  A URI goes away on its own with time. It cannot be cured with medicines, but medicines may be prescribed or recommended to relieve symptoms. Medicines may help:  Reduce your fever.  Reduce your cough.  Relieve nasal congestion. HOME CARE INSTRUCTIONS   Take medicines only as directed by your health care provider.   Gargle warm saltwater or take cough drops to comfort your throat as directed by your health care provider.  Use a warm mist humidifier or inhale steam from a shower to increase air moisture. This may make it easier to breathe.  Drink enough fluid to keep your urine clear or pale yellow.   Eat soups and other clear broths and maintain good nutrition.   Rest as needed.   Return to work when your temperature has returned to normal or as your health care provider advises. You may need to stay home longer to avoid infecting others. You can also use a face mask and careful hand washing to prevent spread of the virus.  Increase the usage of your inhaler if you have asthma.   Do not use any tobacco products, including cigarettes, chewing tobacco, or electronic cigarettes. If you need help quitting, ask your health care provider. PREVENTION  The best way to protect yourself from getting a cold is to practice good hygiene.   Avoid oral or hand contact with people with cold   symptoms.   Wash your hands often if contact occurs.  There is no clear evidence that vitamin C, vitamin E, echinacea, or exercise reduces the chance of developing a cold. However, it is always recommended to get plenty of rest, exercise, and practice good nutrition.  SEEK MEDICAL CARE IF:   You are getting worse rather than better.   Your symptoms are not controlled by medicine.   You have chills.  You have worsening shortness of breath.  You have brown or red mucus.  You have yellow or brown nasal  discharge.  You have pain in your face, especially when you bend forward.  You have a fever.  You have swollen neck glands.  You have pain while swallowing.  You have white areas in the back of your throat. SEEK IMMEDIATE MEDICAL CARE IF:   You have severe or persistent:  Headache.  Ear pain.  Sinus pain.  Chest pain.  You have chronic lung disease and any of the following:  Wheezing.  Prolonged cough.  Coughing up blood.  A change in your usual mucus.  You have a stiff neck.  You have changes in your:  Vision.  Hearing.  Thinking.  Mood. MAKE SURE YOU:   Understand these instructions.  Will watch your condition.  Will get help right away if you are not doing well or get worse.   This information is not intended to replace advice given to you by your health care provider. Make sure you discuss any questions you have with your health care provider.   Document Released: 05/13/2001 Document Revised: 04/03/2015 Document Reviewed: 02/22/2014 Elsevier Interactive Patient Education 2016 Elsevier Inc.  

## 2016-02-07 NOTE — ED Notes (Signed)
Pt reports cough and congestion for the last two days

## 2016-02-07 NOTE — ED Notes (Signed)
States she developed fever  Cough   runny nose about 2 days ago  Afebrile on arrival to ed ..Marland Kitchen

## 2016-02-07 NOTE — ED Provider Notes (Signed)
[facility] Emergency Department Provider Note  ____________________________________________  Time seen: Approximately 6:53 PM  I have reviewed the triage vital signs and the nursing notes.   HISTORY  Chief Complaint Cough and Nasal Congestion    HPI Juanita Lasteratyana Raso is a 25 y.o. female who presents emergency department complaining of fever, cough, nasal congestion 2 days. Symptoms began insidiously. Patient has taken Tylenol and Motrin for fever reduction with good relief. Patient denies any headache, visual acuity changes, neck pain, chest pain, shortness of breath, abdominal pain, nausea or vomiting.   History reviewed. No pertinent past medical history.  Patient Active Problem List   Diagnosis Date Noted  . Labor and delivery, indication for care 04/28/2015  . Supervision of normal pregnancy in third trimester 04/27/2015    History reviewed. No pertinent past surgical history.  Current Outpatient Rx  Name  Route  Sig  Dispense  Refill  . brompheniramine-pseudoephedrine-DM 30-2-10 MG/5ML syrup   Oral   Take 10 mLs by mouth 4 (four) times daily as needed.   200 mL   0   . cetirizine (ZYRTEC) 10 MG tablet   Oral   Take 1 tablet (10 mg total) by mouth daily.   30 tablet   0   . fluticasone (FLONASE) 50 MCG/ACT nasal spray   Each Nare   Place 1 spray into both nostrils 2 (two) times daily.   16 g   0   . medroxyPROGESTERone (DEPO-PROVERA) 150 MG/ML injection   Intramuscular   Inject 1 mL (150 mg total) into the muscle once.   1 mL   0   . Prenatal Vit-Fe Fumarate-FA (PRENATAL MULTIVITAMIN) TABS tablet   Oral   Take 1 tablet by mouth daily at 12 noon.         . Tdap (BOOSTRIX) 5-2.5-18.5 LF-MCG/0.5 injection   Intramuscular   Inject 0.5 mLs into the muscle once.   0.5 mL   0     Allergies Review of patient's allergies indicates no known allergies.  No family history on file.  Social History Social History  Substance  Use Topics  . Smoking status: Never Smoker   . Smokeless tobacco: None  . Alcohol Use: No     Review of Systems  Constitutional: Negative fever/chills Eyes: No visual changes. No discharge ENT: Positive for sore throat. Positive for nasal congestion. Cardiovascular: no chest pain. Respiratory: Positive cough. No SOB. Gastrointestinal: No abdominal pain.  No nausea, no vomiting.  No diarrhea.  No constipation. Skin: Negative for rash. Neurological: Negative for headaches, focal weakness or numbness. 10-point ROS otherwise negative.  ____________________________________________   PHYSICAL EXAM:  VITAL SIGNS: ED Triage Vitals  Enc Vitals Group     BP 02/07/16 1820 127/70 mmHg     Pulse Rate 02/07/16 1820 89     Resp 02/07/16 1820 20     Temp 02/07/16 1820 98.3 F (36.8 C)     Temp Source 02/07/16 1820 Oral     SpO2 02/07/16 1820 96 %     Weight 02/07/16 1820 162 lb (73.483 kg)     Height 02/07/16 1820 5\' 5"  (1.651 m)     Head Cir --      Peak Flow --      Pain Score --      Pain Loc --      Pain Edu? --      Excl. in GC? --      Constitutional: Alert and oriented. Well appearing and in  no acute distress. Eyes: Conjunctivae are normal. PERRL. EOMI. Head: Atraumatic. ENT:      Ears: EACs and TMs are unremarkable bilaterally.      Nose: Clear congestion/rhinnorhea.      Mouth/Throat: Mucous membranes are moist. Oropharynx is erythematous but nonedematous. Positive for strep is identified. Tonsils are unremarkable bilaterally. Neck: No stridor.   Hematological/Lymphatic/Immunilogical: No cervical lymphadenopathy. Cardiovascular: Normal rate, regular rhythm. Normal S1 and S2.  Good peripheral circulation. Respiratory: Normal respiratory effort without tachypnea or retractions. Lungs CTAB. Neurologic:  Normal speech and language. No gross focal neurologic deficits are appreciated.  Skin:  Skin is warm, dry and intact. No rash noted. Psychiatric: Mood and affect are  normal. Speech and behavior are normal. Patient exhibits appropriate insight and judgement.   ____________________________________________   LABS (all labs ordered are listed, but only abnormal results are displayed)  Labs Reviewed - No data to display ____________________________________________  EKG   ____________________________________________  RADIOLOGY   No results found.  ____________________________________________    PROCEDURES  Procedure(s) performed:       Medications - No data to display   ____________________________________________   INITIAL IMPRESSION / ASSESSMENT AND PLAN / ED COURSE  Pertinent labs & imaging results that were available during my care of the patient were reviewed by me and considered in my medical decision making (see chart for details).  Patient's diagnosis is consistent with viral upper respiratory infection. Patient will be discharged home with prescriptions for Zyrtec, Flonase, cough syrup. Patient is to follow up with primary care provider if symptoms persist past this treatment course. Patient is given ED precautions to return to the ED for any worsening or new symptoms.     ____________________________________________  FINAL CLINICAL IMPRESSION(S) / ED DIAGNOSES  Final diagnoses:  Viral upper respiratory illness      NEW MEDICATIONS STARTED DURING THIS VISIT:  New Prescriptions   BROMPHENIRAMINE-PSEUDOEPHEDRINE-DM 30-2-10 MG/5ML SYRUP    Take 10 mLs by mouth 4 (four) times daily as needed.   CETIRIZINE (ZYRTEC) 10 MG TABLET    Take 1 tablet (10 mg total) by mouth daily.   FLUTICASONE (FLONASE) 50 MCG/ACT NASAL SPRAY    Place 1 spray into both nostrils 2 (two) times daily.        This chart was dictated using voice recognition software/Dragon. Despite best efforts to proofread, errors can occur which can change the meaning. Any change was purely unintentional.   Racheal Patches, PA-C 02/07/16  1857  Arnaldo Natal, MD 02/08/16 [phone]

## 2016-02-13 ENCOUNTER — Encounter (HOSPITAL_COMMUNITY): Payer: Self-pay | Admitting: *Deleted

## 2016-12-01 NOTE — L&D Delivery Note (Signed)
Patient is 26 y.o. G60P1001 unknown gestational age and no prenatal care who presents to MAU in second stage labor. Cervix was fully dilated.  Membrane was intact. She was contracting strongly and frequently. She was rushed to L&D and AROM with clear fluid. She pushed and had a viable female infant who appeared term but small for gestational age (possibly IUGR). Patient says her LMP was in mid-July 2017. She denies significant past medical history or history of smoking, alcohol use or recreational drug use.   Delivery Note At 3:15 AM a viable female was delivered via Vaginal, Spontaneous Delivery (Presentation: LOA.  APGAR: 9, 9; weight 5 lb 7.3 oz (2475 g).  Placenta status: normal with three vessel cord. Placenta was sent to pathology. Cord PH was not collected.  Anesthesia:  none Episiotomy: None Lacerations: None Suture Repair: n/a Est. Blood Loss (mL): 100  Mom to postpartum.  Baby to Couplet care / Skin to Skin. Prenatal labs Urine for UDS  Lindsey Morgan 03/03/2017, 3:57 AM  OB FELLOW DELIVERY ATTESTATION  I was gloved and present for the delivery in its entirety, and I agree with the above resident's note.  No prenatal care. Unsure EGA, but baby appeared to be full term, but small for gestational age (5lb 7.3oz). Placenta to pathology. UDS collected.  Social work to be consulted.  Jen Mow, DO OB Fellow 5:44 AM

## 2017-03-03 ENCOUNTER — Encounter (HOSPITAL_COMMUNITY): Payer: Self-pay

## 2017-03-03 ENCOUNTER — Inpatient Hospital Stay (HOSPITAL_COMMUNITY)
Admission: AD | Admit: 2017-03-03 | Discharge: 2017-03-05 | DRG: 775 | Disposition: A | Discharge status: Home / Self Care | Payer: Medicaid Other | Source: Ambulatory Visit | Attending: Obstetrics & Gynecology | Admitting: Obstetrics & Gynecology

## 2017-03-03 DIAGNOSIS — Z3A38 38 weeks gestation of pregnancy: Secondary | ICD-10-CM

## 2017-03-03 DIAGNOSIS — O365930 Maternal care for other known or suspected poor fetal growth, third trimester, not applicable or unspecified: Principal | ICD-10-CM | POA: Diagnosis present

## 2017-03-03 DIAGNOSIS — Z3493 Encounter for supervision of normal pregnancy, unspecified, third trimester: Secondary | ICD-10-CM | POA: Diagnosis present

## 2017-03-03 LAB — RAPID URINE DRUG SCREEN, HOSP PERFORMED
AMPHETAMINES: NOT DETECTED
Barbiturates: NOT DETECTED
Benzodiazepines: NOT DETECTED
Cocaine: NOT DETECTED
OPIATES: NOT DETECTED
Tetrahydrocannabinol: NOT DETECTED

## 2017-03-03 LAB — CBC
HCT: 34.7 % — ABNORMAL LOW (ref 36.0–46.0)
HEMOGLOBIN: 11.6 g/dL — AB (ref 12.0–15.0)
MCH: 24.4 pg — AB (ref 26.0–34.0)
MCHC: 33.4 g/dL (ref 30.0–36.0)
MCV: 73.1 fL — AB (ref 78.0–100.0)
Platelets: 191 10*3/uL (ref 150–400)
RBC: 4.75 MIL/uL (ref 3.87–5.11)
RDW: 14.6 % (ref 11.5–15.5)
WBC: 7.7 10*3/uL (ref 4.0–10.5)

## 2017-03-03 LAB — DIFFERENTIAL
BASOS PCT: 0 %
Basophils Absolute: 0 10*3/uL (ref 0.0–0.1)
EOS PCT: 0 %
Eosinophils Absolute: 0 10*3/uL (ref 0.0–0.7)
LYMPHS PCT: 20 %
Lymphs Abs: 1.6 10*3/uL (ref 0.7–4.0)
MONO ABS: 0.2 10*3/uL (ref 0.1–1.0)
Monocytes Relative: 3 %
NEUTROS ABS: 5.9 10*3/uL (ref 1.7–7.7)
NEUTROS PCT: 76 %

## 2017-03-03 LAB — TYPE AND SCREEN
ABO/RH(D): O POS
Antibody Screen: NEGATIVE

## 2017-03-03 LAB — HIV ANTIBODY (ROUTINE TESTING W REFLEX): HIV SCREEN 4TH GENERATION: NONREACTIVE

## 2017-03-03 LAB — RPR: RPR: NONREACTIVE

## 2017-03-03 MED ORDER — FLEET ENEMA 7-19 GM/118ML RE ENEM: 1.0000 | ENEMA | Freq: Every day | RECTAL | Status: DC | PRN

## 2017-03-03 MED ORDER — OXYCODONE-ACETAMINOPHEN 5-325 MG PO TABS: 2.0000 | ORAL_TABLET | ORAL | Status: DC | PRN

## 2017-03-03 MED ORDER — ZOLPIDEM TARTRATE 5 MG PO TABS: 5.0000 mg | ORAL_TABLET | Freq: Every evening | ORAL | Status: DC | PRN

## 2017-03-03 MED ORDER — ACETAMINOPHEN 325 MG PO TABS: 650.0000 mg | ORAL_TABLET | ORAL | Status: DC | PRN

## 2017-03-03 MED ORDER — ACETAMINOPHEN 325 MG PO TABS
650.0000 mg | ORAL_TABLET | ORAL | Status: DC | PRN
  Administered 2017-03-03: 650 mg via ORAL
  Filled 2017-03-03: qty 2

## 2017-03-03 MED ORDER — TETANUS-DIPHTH-ACELL PERTUSSIS 5-2.5-18.5 LF-MCG/0.5 IM SUSP: 0.5000 mL | Freq: Once | INTRAMUSCULAR | Status: DC

## 2017-03-03 MED ORDER — LACTATED RINGERS IV SOLN: 500.0000 mL | INTRAVENOUS | Status: DC | PRN

## 2017-03-03 MED ORDER — SOD CITRATE-CITRIC ACID 500-334 MG/5ML PO SOLN: 30.0000 mL | ORAL | Status: DC | PRN

## 2017-03-03 MED ORDER — ONDANSETRON HCL 4 MG/2ML IJ SOLN: 4.0000 mg | INTRAMUSCULAR | Status: DC | PRN

## 2017-03-03 MED ORDER — DIBUCAINE 1 % RE OINT: 1.0000 "application " | TOPICAL_OINTMENT | RECTAL | Status: DC | PRN

## 2017-03-03 MED ORDER — OXYTOCIN BOLUS FROM INFUSION
500.0000 mL | Freq: Once | INTRAVENOUS | Status: AC
  Administered 2017-03-03: 500 mL via INTRAVENOUS

## 2017-03-03 MED ORDER — COCONUT OIL OIL
1.0000 "application " | TOPICAL_OIL | Status: DC | PRN
  Administered 2017-03-04: 1 via TOPICAL
  Filled 2017-03-03: qty 120

## 2017-03-03 MED ORDER — SENNOSIDES-DOCUSATE SODIUM 8.6-50 MG PO TABS
2.0000 | ORAL_TABLET | ORAL | Status: DC
  Administered 2017-03-03 – 2017-03-04 (×2): 2 via ORAL
  Filled 2017-03-03 (×2): qty 2

## 2017-03-03 MED ORDER — OXYTOCIN 10 UNIT/ML IJ SOLN: 10.0000 [IU] | Freq: Once | INTRAMUSCULAR | Status: AC

## 2017-03-03 MED ORDER — SIMETHICONE 80 MG PO CHEW: 80.0000 mg | CHEWABLE_TABLET | ORAL | Status: DC | PRN

## 2017-03-03 MED ORDER — ONDANSETRON HCL 4 MG PO TABS: 4.0000 mg | ORAL_TABLET | ORAL | Status: DC | PRN

## 2017-03-03 MED ORDER — OXYCODONE-ACETAMINOPHEN 5-325 MG PO TABS: 1.0000 | ORAL_TABLET | ORAL | Status: DC | PRN

## 2017-03-03 MED ORDER — OXYTOCIN 40 UNITS IN LACTATED RINGERS INFUSION - SIMPLE MED
2.5000 [IU]/h | INTRAVENOUS | Status: DC
  Filled 2017-03-03: qty 1000

## 2017-03-03 MED ORDER — LACTATED RINGERS IV SOLN
INTRAVENOUS | Status: DC
  Administered 2017-03-03: 04:00:00 via INTRAVENOUS

## 2017-03-03 MED ORDER — LIDOCAINE HCL (PF) 1 % IJ SOLN
30.0000 mL | INTRAMUSCULAR | Status: DC | PRN
  Filled 2017-03-03: qty 30

## 2017-03-03 MED ORDER — DIPHENHYDRAMINE HCL 25 MG PO CAPS: 25.0000 mg | ORAL_CAPSULE | Freq: Four times a day (QID) | ORAL | Status: DC | PRN

## 2017-03-03 MED ORDER — WITCH HAZEL-GLYCERIN EX PADS: 1.0000 "application " | MEDICATED_PAD | CUTANEOUS | Status: DC | PRN

## 2017-03-03 MED ORDER — OXYTOCIN 10 UNIT/ML IJ SOLN
10.0000 [IU] | Freq: Once | INTRAMUSCULAR | Status: AC
  Administered 2017-03-03: 10 [IU] via INTRAMUSCULAR

## 2017-03-03 MED ORDER — ONDANSETRON HCL 4 MG/2ML IJ SOLN: 4.0000 mg | Freq: Four times a day (QID) | INTRAMUSCULAR | Status: DC | PRN

## 2017-03-03 MED ORDER — BENZOCAINE-MENTHOL 20-0.5 % EX AERO: 1.0000 "application " | INHALATION_SPRAY | CUTANEOUS | Status: DC | PRN

## 2017-03-03 MED ORDER — PRENATAL MULTIVITAMIN CH
1.0000 | ORAL_TABLET | Freq: Every day | ORAL | Status: DC
  Administered 2017-03-03 – 2017-03-05 (×3): 1 via ORAL
  Filled 2017-03-03 (×3): qty 1

## 2017-03-03 MED ORDER — IBUPROFEN 600 MG PO TABS
600.0000 mg | ORAL_TABLET | Freq: Four times a day (QID) | ORAL | Status: DC
  Administered 2017-03-03 – 2017-03-05 (×10): 600 mg via ORAL
  Filled 2017-03-03 (×10): qty 1

## 2017-03-03 NOTE — H&P (Signed)
LABOR ADMISSION HISTORY AND PHYSICAL  Lindsey Morgan is a 26 y.o. female G2P10001 with IUP with no prenatal care who presents to MAU in second stage labor. FOB at bedside reports that she had contraction the whole day yesterday. Patient has no prenatal care. She denies significant personal medical history. This is second pregnancy. She says her LMP was mid July 2017 but not sure of the exact date.  She denies smoking, drinking EtOH or recreational drug use.  She is planning to breast feed the baby. She want Depo for contraception. She says she takes her other child to pediatric office in [place].   Prenatal History/Complications:  NO PRENATAL CARE  Past Medical History: Past Medical History:  Diagnosis Date  . Migraines     Past Surgical History: History reviewed. No pertinent surgical history.  Obstetrical History: OB History    Gravida Para Term Preterm AB Living   2 1 0 0 0 1   SAB TAB Ectopic Multiple Live Births   0 0 0   1      Social History: Social History   Social History  . Marital status: Single    Spouse name: N/A  . Number of children: N/A  . Years of education: N/A   Social History Main Topics  . Smoking status: Never Smoker  . Smokeless tobacco: Never Used  . Alcohol use No  . Drug use: No  . Sexual activity: Yes    Birth control/ protection: None   Other Topics Concern  . None   Social History Narrative   ** Merged History Encounter **        Family History: History reviewed. No pertinent family history.  Allergies: No Known Allergies  Prescriptions Prior to Admission  Medication Sig Dispense Refill Last Dose  . Prenatal Vit-Fe Fumarate-FA (PRENATAL MULTIVITAMIN) TABS tablet Take 1 tablet by mouth daily at 12 noon.   Past Week at Unknown time  . Prenatal Vit-Fe Fumarate-FA (PRENATAL VITAMINS) 28-0.8 MG TABS Take 1 tablet by mouth daily. 30 tablet 3 Past Week at Unknown time  . brompheniramine-pseudoephedrine-DM 30-2-10 MG/5ML syrup  Take 10 mLs by mouth 4 (four) times daily as needed. 200 mL 0 Unknown at Unknown time  . cephALEXin (KEFLEX) 500 MG capsule Take 1 capsule (500 mg total) by mouth 4 (four) times daily. (Patient not taking: Reported on 12/13/2014) 28 capsule 0 Unknown at Unknown time  . cetirizine (ZYRTEC) 10 MG tablet Take 1 tablet (10 mg total) by mouth daily. 30 tablet 0 Unknown at Unknown time  . fluticasone (FLONASE) 50 MCG/ACT nasal spray Place 1 spray into both nostrils 2 (two) times daily. 16 g 0 Unknown at Unknown time  . ibuprofen (ADVIL,MOTRIN) 400 MG tablet Take 800 mg by mouth every 6 (six) hours as needed for mild pain.   Unknown at Unknown time  . medroxyPROGESTERone (DEPO-PROVERA) 150 MG/ML injection Inject 1 mL (150 mg total) into the muscle once. 1 mL 0 Unknown at Unknown time  . ondansetron (ZOFRAN) 4 MG tablet Take 1 tablet (4 mg total) by mouth every 8 (eight) hours as needed for nausea. (Patient not taking: Reported on 12/13/2014) 12 tablet 0 Unknown at Unknown time  . Tdap (BOOSTRIX) 5-2.5-18.5 LF-MCG/0.5 injection Inject 0.5 mLs into the muscle once. 0.5 mL 0 Unknown at Unknown time     Review of Systems  Blood pressure 136/86, pulse 90, resp. rate (!) 22, unknown if currently breastfeeding. GEN: appearance: alert and appears stated age. She is quit uncomfortable  due to pain RESP: clear to auscultation bilaterally, no increased WOB CVS:: regular rate and rhythm, no murmurs, no sign of DVT, +2 DP GI: soft, non-tender; bowel sounds normal MSK: WWP, Homans sign is negative,  NEURO: grossly intact PSYCH: restless and uncomfortable due to labor pain  Pelvic Exam: Cervical exam: Dilation: 10 Effacement (%): 100 Station: Crowning Exam by:: Dr. Omer Jack Presentation: cephalic Uterine activity: frequent strong contraction  Prenatal labs: Patient hasn't had prenatal care  Prenatal Transfer Tool  Maternal Diabetes: No per patient report Genetic Screening: never had prenatal care Maternal  Ultrasounds/Referrals: never had prenatal care Fetal Ultrasounds or other Referrals:  never had prenatal care Maternal Substance Abuse:  Denies Significant Maternal Medications:  none Significant Maternal Lab Results: never had prenatal care  No results found for this or any previous visit (from the past 24 hour(s)).  Patient Active Problem List   Diagnosis Date Noted  . Normal labor and delivery 03/03/2017  . Labor and delivery, indication for care 04/28/2015  . Supervision of normal pregnancy in third trimester 04/27/2015    Assessment: Lindsey Morgan is a 26 y.o. G2P1001 unknown gestational age who presents to MAU in second stable labor. She says her LMP was mid-July 2017  #Labor: Second stage. Expectant. Will obtain [facility] labs and UDS #Pain: Second stage #FWB:  good #ID: Unknown.  #MOF: breast #MOC: will assess #Circ: n/a  Almon Hercules 03/03/2017, 3:43 AM   OB FELLOW HISTORY AND PHYSICAL ATTESTATION  I have seen and examined this patient; I agree with above documentation in the resident's note. Patient came through MAU and was found to be completely dilated at +2 station and bulging bag. Patient was able to be transported to L&D, where AROM performed and imminent delivery of infant. Please see delivery note.   Jen Mow, DO OB Fellow 03/03/2017, 5:49 AM

## 2017-03-03 NOTE — MAU Note (Signed)
Pt here with c/o contractions; urge to push

## 2017-03-03 NOTE — Lactation Note (Signed)
This note was copied from a baby's chart. Lactation Consultation Note  Patient Name: Lindsey Morgan [id number] Date: 03/03/2017 Reason foValaree Fresquezal assessment  Initial visit at 15 hours of age.  Mom reports good feedings.  babyis 5#7oz with questionable gestation due to limiter prenatal care.  Mom has an older child with weight of 6#3oz at delivery that breastfed for 12months.  Mom reports stress and struggles during that time. Mom reports she will be returning to school and works nights full-time so she is unsure how she will do with breastfeeding. LC encouraged mom to focus on establishing a good milk supply during 1st 2 weeks and then work with [facility] as needed to transition with pumping. Mom reports a good supply with older child.  Mom has baby latched in cradle hold on right breast with narrow gape and shallow latch.  LC encouraged mom to try cross cradle hold and compress breasts during feeding.  LC encouraged mom to watch baby for swallows during feedings. LC discussed LBW and encouraged mom to be proactive with spoon feeding of EBM after latching to increase easy calories for baby.   LC observed baby feed for several minutes with strong bursts of sucking and intermittent good jaw movements.   LC assisted with spoon feeding of about .   [facility] LC resources given and discussed.  Encouraged to feed with early cues on demand.  Early newborn behavior discussed.  Hand expression demonstrated by mom with colostrum visible.  Mom to call for assist as needed.        Maternal Data Has patient been taught Hand Expression?: Yes Does the patient have breastfeeding experience prior to this delivery?: Yes  Feeding Feeding Type: Breast Fed Length of feed: 20 min  LATCH Score/Interventions Latch: Grasps breast easily, tongue down, lips flanged, rhythmical sucking. Intervention(s): Adjust position;Assist with latch;Breast massage;Breast compression  Audible Swallowing: A few with  stimulation  Type of Nipple: Everted at rest and after stimulation  Comfort (Breast/Nipple): Soft / non-tender     Hold (Positioning): Assistance needed to correctly position infant at breast and maintain latch. Intervention(s): Breastfeeding basics reviewed;Support Pillows;Position options;Skin to skin  LATCH Score: 8  Lactation Tools Discussed/Used WIC Program: No   Consult Status Consult Status: Follow-up Date: 03/04/17 Follow-up type: In-patient    Beverely Risen Arvella Merles 03/03/2017, 6:19 PM

## 2017-03-04 LAB — CBC
HCT: 33.6 % — ABNORMAL LOW (ref 36.0–46.0)
Hemoglobin: 11.3 g/dL — ABNORMAL LOW (ref 12.0–15.0)
MCH: 24.5 pg — ABNORMAL LOW (ref 26.0–34.0)
MCHC: 33.6 g/dL (ref 30.0–36.0)
MCV: 72.7 fL — ABNORMAL LOW (ref 78.0–100.0)
Platelets: 225 10*3/uL (ref 150–400)
RBC: 4.62 MIL/uL (ref 3.87–5.11)
RDW: 14.8 % (ref 11.5–15.5)
WBC: 8.3 10*3/uL (ref 4.0–10.5)

## 2017-03-04 LAB — RUBELLA SCREEN: Rubella: 8.41 index (ref >0.99)

## 2017-03-04 LAB — ABO/RH: ABO/RH(D): O POS

## 2017-03-04 NOTE — Lactation Note (Signed)
This note was copied from a baby's chart. Lactation Consultation Note  Patient Name: Lindsey Morgan ZOXWR'U Date: 03/04/2017 Reason for consult: Follow-up assessment Baby at 42 hr of life. Upon entry baby was sleeping on mom's chest. Mom reports baby is latching well. She denies breast or nipple pain. She stated her nipple feel dry. Given coconut oil. She thinks that her milk is flowing "much better this time" and "she latches much better than her sister". She can manually express and has spoon at bedside. Discussed baby behavior, feeding frequency, baby belly size, voids, wt loss, breast changes, and nipple care. Mom is aware of lactation services and support group.    Maternal Data    Feeding Feeding Type: Breast Fed Length of feed: 17 min  LATCH Score/Interventions Latch:  (No latch witnessed) Intervention(s): Adjust position  Audible Swallowing: A few with stimulation Intervention(s): Skin to skin Intervention(s): Skin to skin  Type of Nipple: Everted at rest and after stimulation  Comfort (Breast/Nipple): Filling, red/small blisters or bruises, mild/mod discomfort     Hold (Positioning): No assistance needed to correctly position infant at breast. Intervention(s): Skin to skin  LATCH Score: 8  Lactation Tools Discussed/Used     Consult Status Consult Status: Follow-up Date: 03/05/17 Follow-up type: In-patient    Rulon Eisenmenger 03/04/2017, 9:52 PM

## 2017-03-04 NOTE — Progress Notes (Signed)
Post Partum Day #1 Subjective: no complaints, up ad lib, voiding, tolerating PO and reports normal lochia, she was told by peds that baby will need to stay 48 hours due to No PNC  Objective: Blood pressure 118/71, pulse 72, temperature 98.6 F (37 C), temperature source Oral, resp. rate 18, SpO2 100 %, unknown if currently breastfeeding.  Physical Exam:  General: alert Lochia: appropriate Uterine Fundus: firm and NT at U DVT Evaluation: No evidence of DVT seen on physical exam.   Recent Labs  03/03/17 0400 03/04/17 0538  HGB 11.6* 11.3*  HCT 34.7* 33.6*    Assessment/Plan: Plan for discharge tomorrow  Plans depo provera before discharge home   LOS: 1 day   Lindsey Morgan C Lindsey Morgan 03/04/2017, 6:40 AM

## 2017-03-04 NOTE — Clinical Social Work Maternal (Signed)
  CLINICAL SOCIAL WORK MATERNAL/CHILD NOTE  Patient Details  Name: Lindsey Morgan MRN: [id number] Date of Birth: 04/07/1991  Date:  2017-05-12  Clinical Social Worker Initiating Note:  Laurey Arrow Date/ Time Initiated:  03/04/17/1205     Child's Name:  Lindsey Morgan   Legal Guardian:  Mother (FOB is Lindsey Morgan 05/19/89)   Need for Interpreter:  None   Date of Referral:  09-Mar-2017     Reason for Referral:  Late or No Prenatal Care    Referral Source:  [facility]   Address:  [address] [place] [address]  Phone number:  [phone]   Household Members:  Self, Minor Children (MOB's oldest child is Lindsey Morgan 04/28/15)   Natural Supports (not living in the home):  Immediate Family (FOB will also be a source of support.)   Professional Supports: None   Employment: Full-time   Type of Work: Quarry manager   Education:      Pensions consultant:  [employer]   Other Resources:      Cultural/Religious Considerations Which May Impact Care:  None Reported  Strengths:      Risk Factors/Current Problems:  None   Cognitive State:  Alert , Able to Concentrate , Linear Thinking , Insightful    Mood/Affect:  Bright , Happy , Interested , Comfortable   CSW Assessment: CSW met with MOB to complete an assessment for [facility].  When CSW arrived, MOB was resting in bed and infant was asleep in bassinet.  CSW inquired about MOB's supports and MOB reported that MOB will be supported by MOB's and FOB's immediate family members.  CSW asked about MOB's lack of  PNC and MOB reported that MOB pregnancy was not confirmed until MOB was around 26 weeks.  MOB stated that at that time, MOB had difficulty with finding a practice to become an established patient. CSW informed MOB of the hospital's NPNC SA policy, and MOB understood. MOB denied the use of any substance (illegal and prescribed) and did not appear anxious and apprehensive about the hospital's drug screen policy. CSW  informed MOB of the 2 screenings for the infant. MOB appeared understanding and communicated she is not nervous about infant's drug screenings. CSW shared with MOB that infant's UDS was negative and CSW will continue to monitor the infant's CDS. CSW made MOB aware that if the either are positive without an explanation, CSW will make a report to [facility] CPS. MOB denied CPS hx and was understanding. MOB denied DV,SI,HI, and hx of PPD.  MOB also denied any barrier for infant's follow-up appointments. CSW thanked MOB for meeting with CSW and provided MOB with CSW's contact information.    CSW Plan/Description:  Information/Referral to [employer] , No Further Intervention Required/No Barriers to Discharge, Patient/Family Education  (CSW will follow infant's CDS and will make a report if warranted. )    Laurey Arrow, MSW, LCSW Clinical Social Work [phone]   Dimple Nanas, LCSW 2016/12/26, 12:17 PM

## 2017-03-05 LAB — HEPATITIS B SURFACE ANTIGEN: HEP B S AG: NEGATIVE

## 2017-03-05 MED ORDER — IBUPROFEN 600 MG PO TABS: 600.0000 mg | ORAL_TABLET | Freq: Four times a day (QID) | ORAL | 0 refills | Status: DC

## 2017-03-05 MED ORDER — MEDROXYPROGESTERONE ACETATE 150 MG/ML IM SUSP
150.0000 mg | Freq: Once | INTRAMUSCULAR | Status: AC
  Administered 2017-03-05: 150 mg via INTRAMUSCULAR
  Filled 2017-03-05: qty 1

## 2017-03-05 NOTE — Discharge Instructions (Signed)

## 2017-03-05 NOTE — Lactation Note (Addendum)
This note was copied from a baby's chart. Lactation Consultation Note f/u baby 5 5lbs w/2% weight loss. Had 7 voids, 9 stools in 42 hrs of age.  Asked mom if she wanted to use DEBP for stimulation to breast. Mom stated baby is cluster feeding now, she didn't know when she could pump. Mom doesn't have DEBP at home. Gave hand pump instructed how to use. Mom stated she remembers. Mom has 24 yr old and Bf her for 1 yr. Denies infections or issues.  Educated on babies under 6 lbs behavior. Mom feels that baby is getting plenty of milk d/t weight stable, good I&O, rest well between feedings. Discussed importance of I&O, supply and demand. Baby requiring to feed every 2-3 hrs. No latch by RN during night shift. Encouraged to call Rn for latch for next feeding.   Patient Name: Lindsey Morgan ZOXWR'U Date: 03/05/2017 Reason for consult: Follow-up assessment;Infant < 6lbs   Maternal Data    Feeding Feeding Type: Breast Fed Length of feed: 10 min  LATCH Score/Interventions       Type of Nipple: Everted at rest and after stimulation  Comfort (Breast/Nipple): Soft / non-tender     Hold (Positioning): No assistance needed to correctly position infant at breast. Intervention(s): Breastfeeding basics reviewed;Support Pillows;Position options;Skin to skin     Lactation Tools Discussed/Used     Consult Status Consult Status: Follow-up Date: 03/05/17 (in pm) Follow-up type: In-patient    Tremon Sainvil, Diamond Nickel 03/05/2017, 6:10 AM

## 2017-03-05 NOTE — Lactation Note (Signed)
This note was copied from a baby's chart. Lactation Consultation Note: Experieced BF mom reports baby has just finished feeding- reports she has been cluster feeding through the night. Reports breasts are feeling heavier this morning and is able to hand express whitish milk. Has Medela DEBP from home. No pain with latch. 2 % weight loss. Good output. No questions at present. Reviewed our phone number, OP appointments and BFSG as resources for support after DC. To call prn  Patient Name: Lindsey Morgan [id number] Date: 03/05/2017 Reason for consult: Follow-up assessment;Infant < 6lbs   Maternal Data Formula Feeding for Exclusion: No Has patient been taught Hand Expression?: Yes Does the patient have breastfeeding experience prior to this delivery?: Yes  Feeding Feeding Type: Breast Fed Length of feed: 10 min  LATCH Score/Interventions           Lactation Tools Discussed/Used     Consult Status Complete   Pamelia Hoit 03/05/2017, 10:01 AM

## 2017-03-05 NOTE — Discharge Summary (Signed)
OB Discharge Summary  Patient Name: Lindsey Morgan DOB: [birth year] MRN: [id number]  Date of admission: 03/03/2017 Delivering MD: Michaele Offer   Date of discharge: 03/05/2017  Admitting diagnosis: 38 WEEKS NO PNC 38 WEEKS CTX  Intrauterine pregnancy: Unknown     Secondary diagnosis:Active Problems:   Normal labor and delivery   NSVD (normal spontaneous vaginal delivery)  Additional problems:no prenatal care     Discharge diagnosis: Term Pregnancy Delivered, precipitous delivery                                                                 Complications: None  Hospital course:  Onset of Labor With Vaginal Delivery     26 y.o. yo [id number] at Unknown was admitted in Active Labor on 03/03/2017. Patient had an uncomplicated labor course as follows:  Membrane Rupture Time/Date: 3:15 AM ,03/03/2017   Intrapartum Procedures: Episiotomy: None [1]                                         Lacerations:  None [1]  Patient had a delivery of a Viable infant. 03/03/2017  Information for the patient's newborn:  Sugey, Trevathan [[id number]]  Delivery Method: Vag-Spont    Pateint had an uncomplicated postpartum course.  She is ambulating, tolerating a regular diet, passing flatus, and urinating well. Patient is discharged home in stable condition on 03/05/17.   Physical exam  Vitals:   03/04/17 0926 03/04/17 1539 03/04/17 1900 03/05/17 0541  BP: 111/66 118/71 125/87 (!) 113/54  Pulse: 65 71 85 82  Resp: Temp: 97.6 F (36.4 C) 98.1 F (36.7 C) 98.2 F (36.8 C) 98.6 F (37 C)  TempSrc: Oral Oral Oral Oral  SpO2: 100% 100%     General: alert Lochia: appropriate Uterine Fundus: firm and NT at U Incision: N/A DVT Evaluation: No evidence of DVT seen on physical exam. Labs: Lab Results  Component Value Date   WBC 8.3 03/04/2017   HGB 11.3 (L) 03/04/2017   HCT 33.6 (L) 03/04/2017   MCV 72.7 (L) 03/04/2017   PLT 225 03/04/2017   CMP Latest Ref  Rng & Units 03/16/2015  Glucose mg/dL 91  BUN mg/dL 6  Creatinine mg/dL 0.86  Sodium mmol/L 578(I)  Potassium mmol/L 3.3(L)  Chloride mmol/L 104  CO2 mmol/L 21(L)  Calcium mg/dL 7.8(L)  Total Protein g/dL 6.9(G)  Total Bilirubin mg/dL 1.1  Alkaline Phos U/L 63  AST U/L 23  ALT U/L 13(L)    Discharge instruction: per After Visit Summary and "Baby and Me Booklet".  After Visit Meds:  Allergies as of 03/05/2017      Reactions   Mushroom Extract Complex Swelling      Medication List    TAKE these medications   ibuprofen 600 MG tablet Commonly known as:  ADVIL,MOTRIN Take 1 tablet (600 mg total) by mouth every 6 (six) hours.   Prenatal Vitamins 28-0.8 MG Tabs Take 1 tablet by mouth daily.       Diet: routine diet  Activity: Advance as tolerated. Pelvic rest for 6 weeks.   Outpatient follow up:6 weeks Follow up Appt:No  future appointments. Follow up visit: No Follow-up on file.  Postpartum contraception: Depo Provera  Newborn Data: Live born female  Birth Weight: 5 lb 7.3 oz (2475 g) APGAR: 9, 9  Baby Feeding: Breast Disposition:home with mother   03/05/2017 Allie Bossier, MD

## 2017-04-02 IMAGING — CR DG CHEST 1V PORT
1 series · 1 of 1 positions shown · non-contrast
Comparison: None.

CLINICAL DATA: Chest pain for 2 days.  Pregnancy.

EXAM:
PORTABLE CHEST - 1 VIEW

[ap]
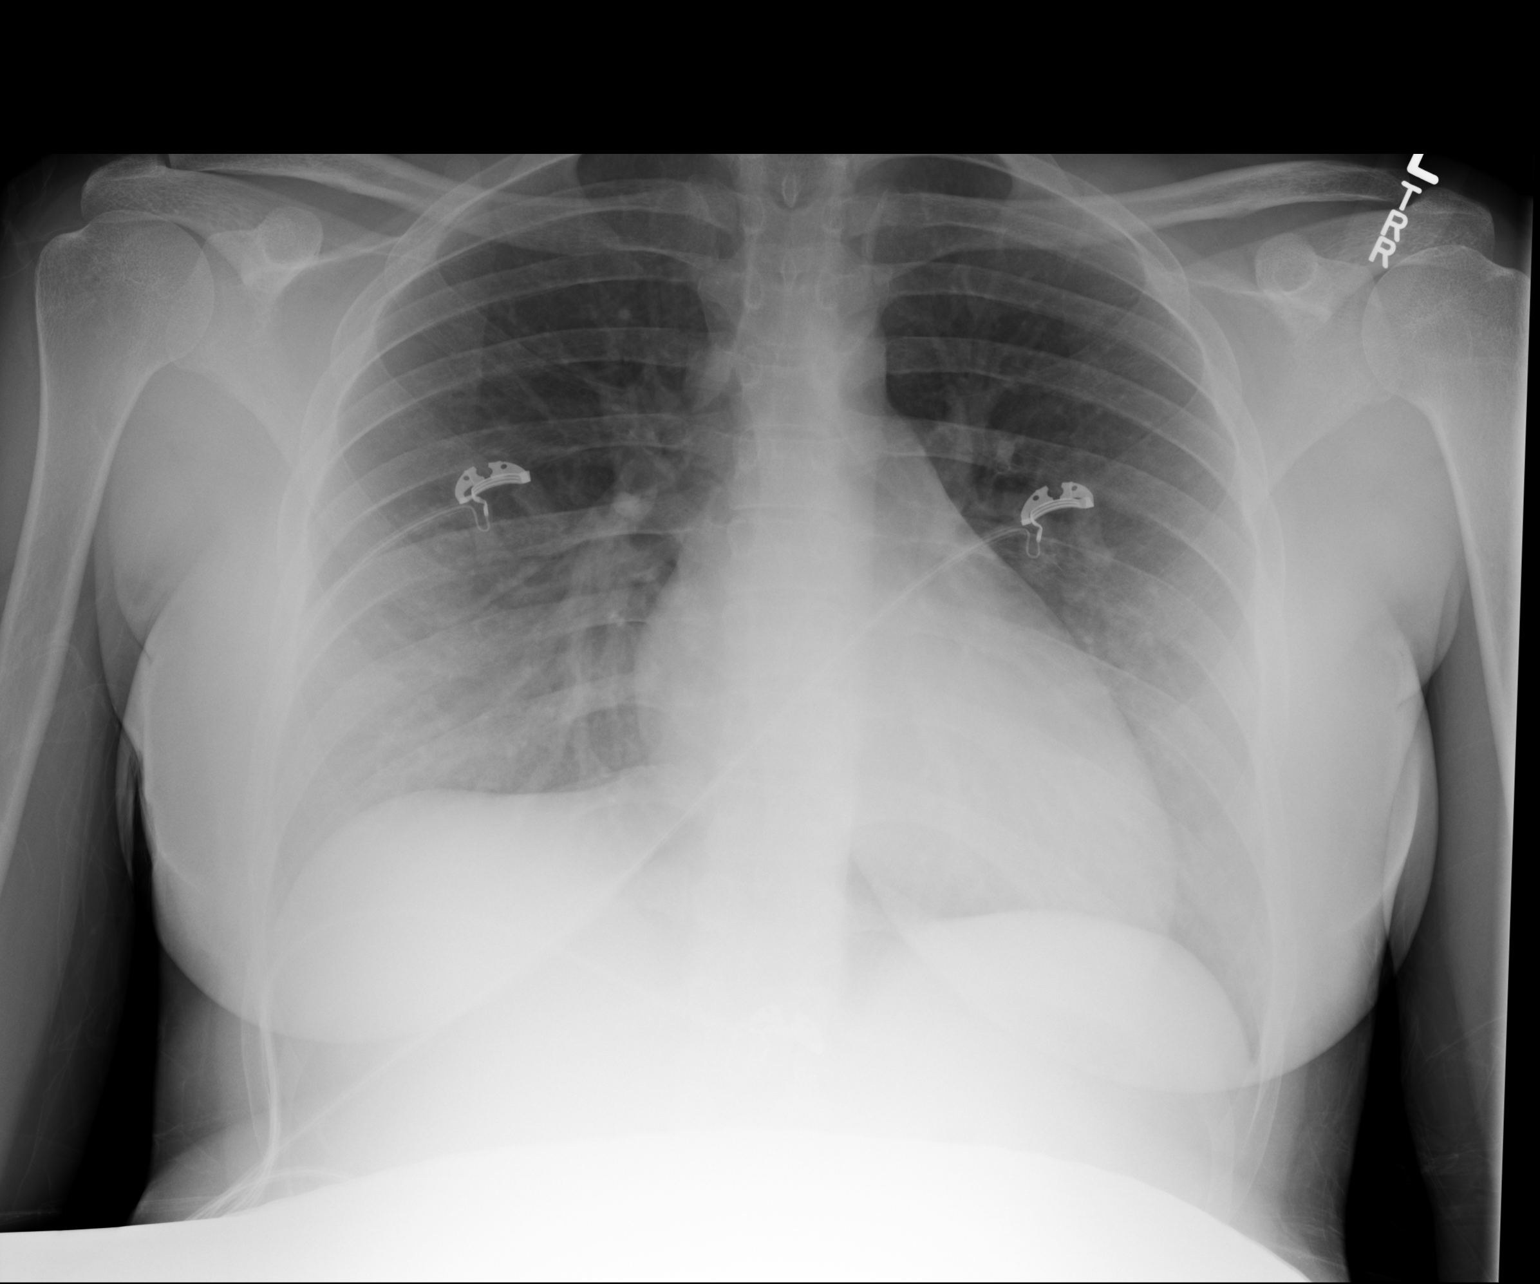

[1 of 1 positions shown; findings below may reference images not displayed]

FINDINGS: Generous heart size but no cardiomegaly for technique. Shallow
inspiration without collapse or pneumonia. No edema, effusion, or
pneumothorax.
IMPRESSION: Negative portable chest.

## 2017-04-02 IMAGING — CT CT ANGIO CHEST
1 of 2 series · 18 of 30 positions shown · IV contrast (APPLIED)
Comparison: None.

CLINICAL DATA: Pleuritic chest pain on the left in pregnancy.

EXAM:
CT ANGIOGRAPHY CHEST WITH CONTRAST
TECHNIQUE: Multidetector CT imaging of the chest was performed using the
standard protocol during bolus administration of intravenous
contrast. Multiplanar CT image reconstructions and MIPs were
obtained to evaluate the vascular anatomy.
CONTRAST:  75 cc Omnipaque 350 intravenous

[Series 5: pe 1.0 thins · axial · 0.70mm/px · z∈[-604,-408]mm · 18 of 221 slices shown]
[im 13/221  lung]
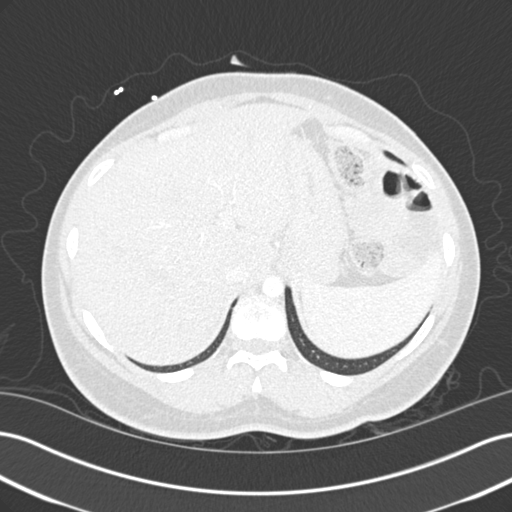
[im 25/221  mediastinal]
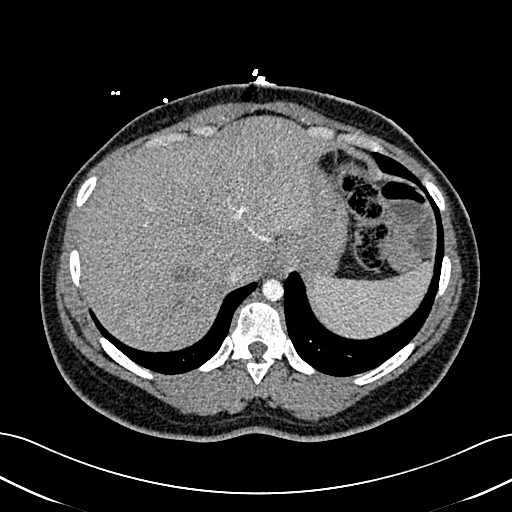
[im 37/221  lung]
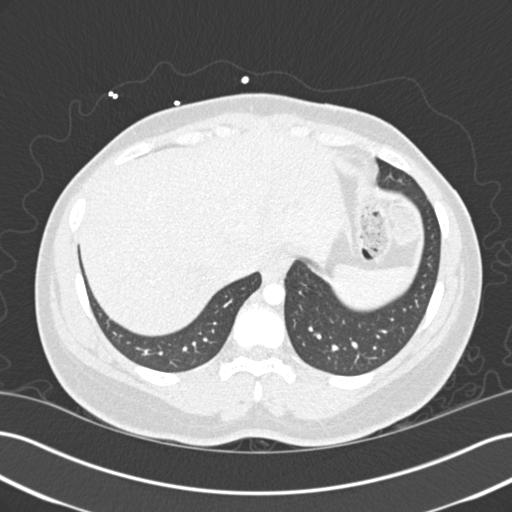
[im 49/221  mediastinal]
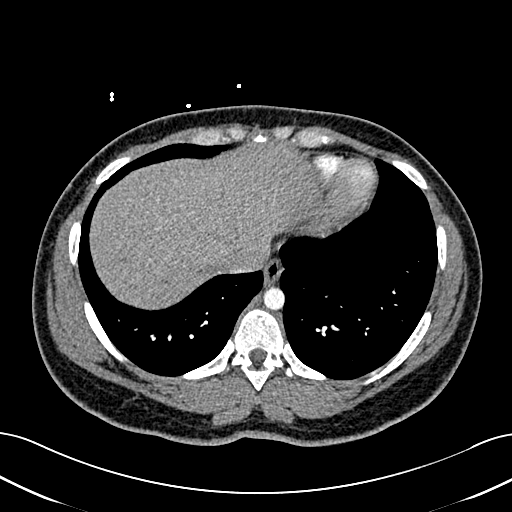
[im 62/221  lung]
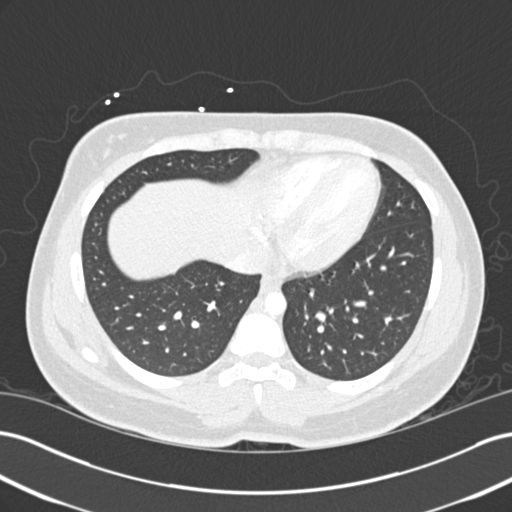
[im 74/221  mediastinal]
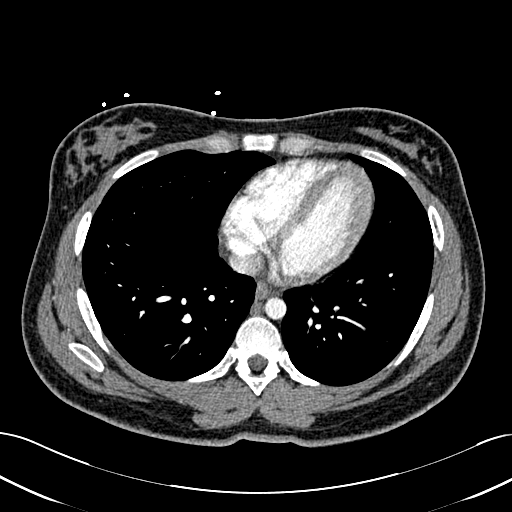
[im 86/221  lung]
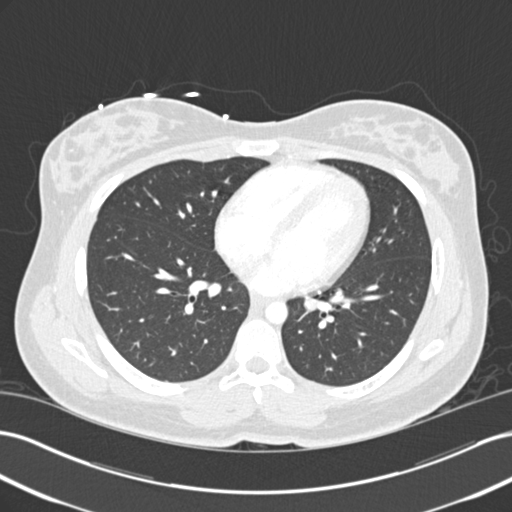
[im 98/221  mediastinal]
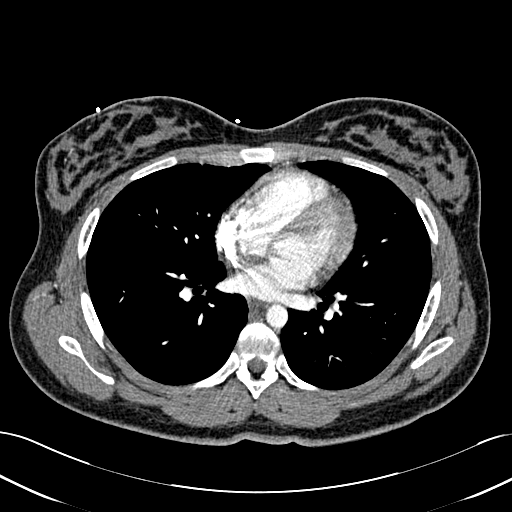
[im 101/221  lung]
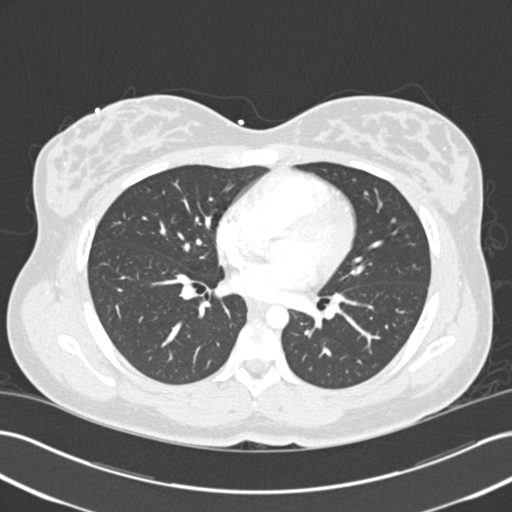
[im 111/221  mediastinal]
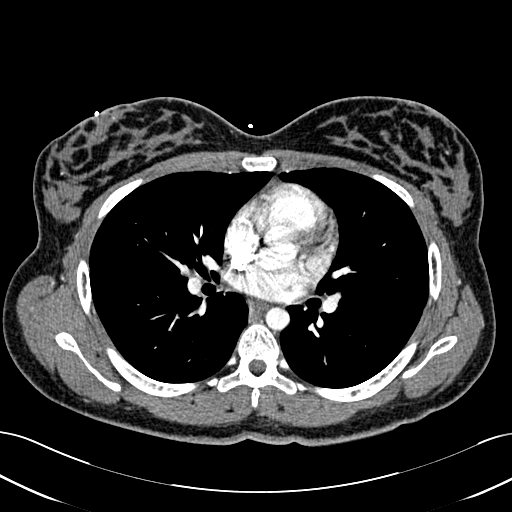
[im 123/221  lung]
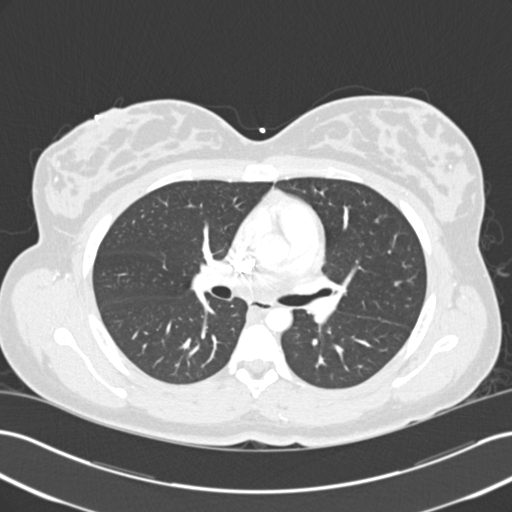
[im 135/221  mediastinal]
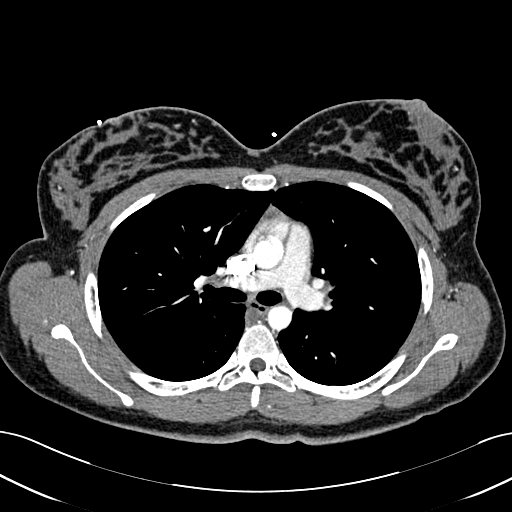
[im 147/221  lung]
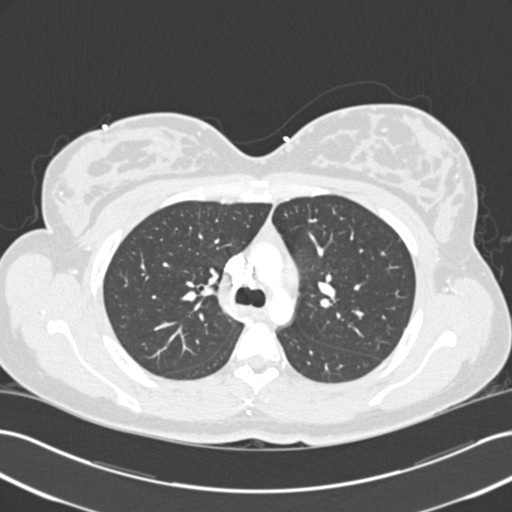
[im 159/221  mediastinal]
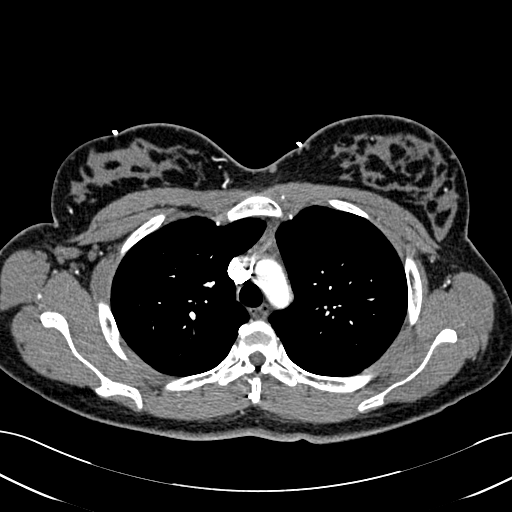
[im 172/221  lung]
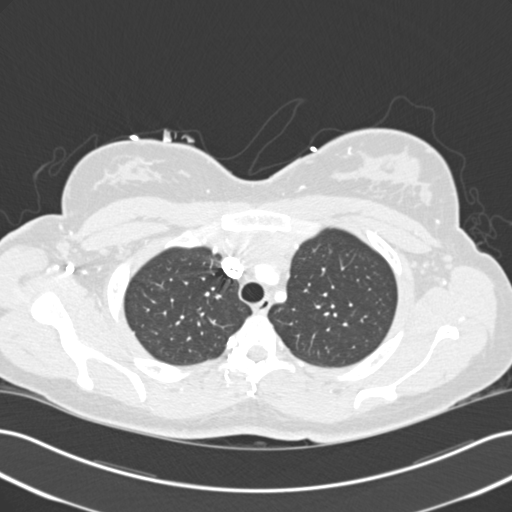
[im 184/221  mediastinal]
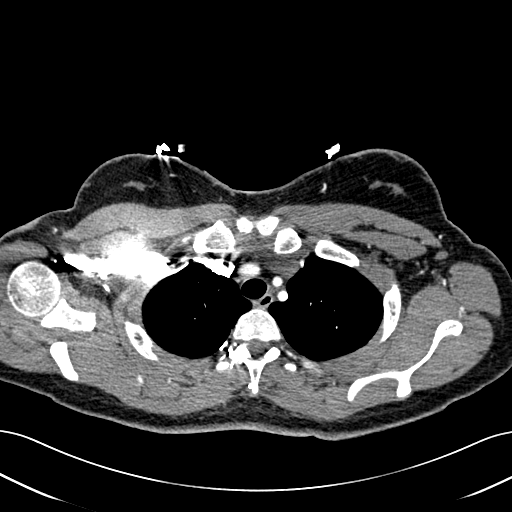
[im 196/221  lung]
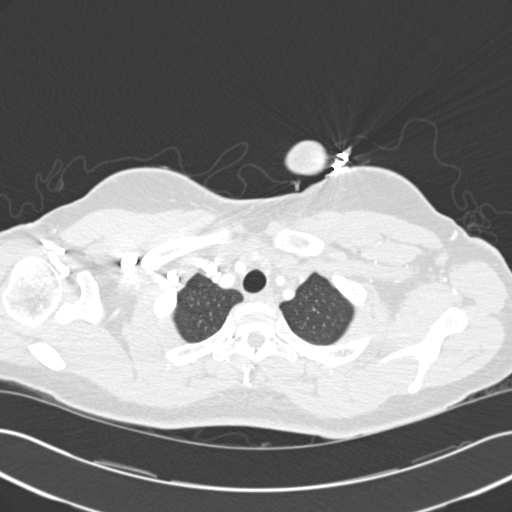
[im 208/221  mediastinal]
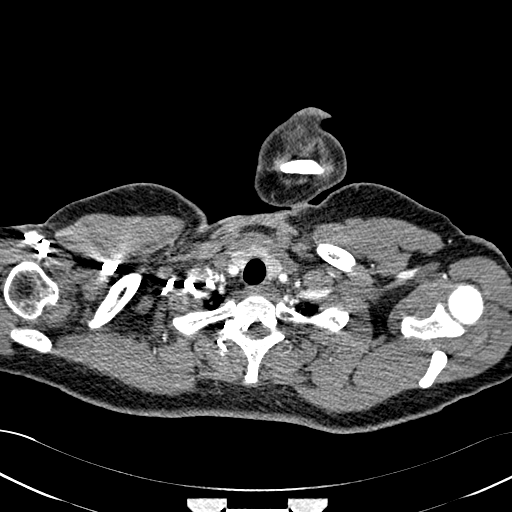

[18 of 30 positions shown; findings below may reference images not displayed]

FINDINGS: THORACIC INLET/BODY WALL:

No acute abnormality.

MEDIASTINUM:

Normal heart size. No pericardial effusion. No acute vascular
abnormality, including pulmonary embolism or aortic dissection. No
adenopathy.

LUNG WINDOWS:

No consolidation. No effusion. Tiny, smooth sub pleural pulmonary
nodules are likely lymph nodes in this young patient.

UPPER ABDOMEN:

No acute findings.

OSSEOUS:

No acute fracture.  No suspicious lytic or blastic lesions.

Review of the MIP images confirms the above findings.
IMPRESSION: Negative chest CTA.

## 2017-12-01 NOTE — L&D Delivery Note (Signed)
Delivery Note At 9:10 AM, on 11/21/2018, a viable female was delivered via Vaginal, Spontaneous (Presentation: Direct Occiput Anterior with restitution to ROT ).  Shoulders delivered easily and infant with good tone and spontaneous cry. Tactile stimulation given by provider and infant placed on mother's abdomen where nurse continued tactile stimulation. Infant APGAR: 8, 9. Cord clamped, cut, and blood collected. Placenta delivered spontaneously and noted to be intact with 3VC upon inspection.  Vaginal inspection revealed no lacerations.  Fundus firm, at the umbilicus, and bleeding small.  Mother hemodynamically stable and infant skin to skin prior to provider exit.  Mother desires BTL with Depo Provera bridge for birth control method and opts to bottle feed.  Infant weight at one hour of life: 6lbs 6oz, 18.75in.  Anesthesia: None  Episiotomy: None Lacerations: None Suture Repair: N/A Est. Blood Loss (mL): 5  Mom to postpartum.  Baby to Couplet care / Skin to Skin.  Cherre RobinsJessica L Emerald Gehres MSN, CNM 11/21/2018, 9:37 AM

## 2018-08-23 ENCOUNTER — Ambulatory Visit: Payer: 59

## 2018-08-31 ENCOUNTER — Other Ambulatory Visit (HOSPITAL_COMMUNITY)
Admission: RE | Admit: 2018-08-31 | Discharge: 2018-08-31 | Disposition: A | Discharge status: Home / Self Care | Payer: Medicaid Other | Source: Ambulatory Visit | Attending: Obstetrics and Gynecology | Admitting: Obstetrics and Gynecology

## 2018-08-31 ENCOUNTER — Other Ambulatory Visit: Payer: Self-pay

## 2018-08-31 ENCOUNTER — Ambulatory Visit (INDEPENDENT_AMBULATORY_CARE_PROVIDER_SITE_OTHER): Payer: Medicaid Other | Admitting: *Deleted

## 2018-08-31 ENCOUNTER — Encounter: Payer: Self-pay | Admitting: General Practice

## 2018-08-31 VITALS — BP 118/74 | HR 88 | Temp 98.3°F | Ht 65.0 in | Wt 198.0 lb

## 2018-08-31 DIAGNOSIS — Z3A26 26 weeks gestation of pregnancy: Secondary | ICD-10-CM | POA: Insufficient documentation

## 2018-08-31 DIAGNOSIS — Z3482 Encounter for supervision of other normal pregnancy, second trimester: Secondary | ICD-10-CM | POA: Insufficient documentation

## 2018-08-31 DIAGNOSIS — Z3480 Encounter for supervision of other normal pregnancy, unspecified trimester: Secondary | ICD-10-CM

## 2018-08-31 DIAGNOSIS — Z3201 Encounter for pregnancy test, result positive: Secondary | ICD-10-CM

## 2018-08-31 LAB — POCT URINALYSIS DIPSTICK OB
BILIRUBIN UA: NEGATIVE
Glucose, UA: NEGATIVE
Ketones, UA: 15
NITRITE UA: POSITIVE
PH UA: 6.5 (ref 5.0–8.0)
POC,PROTEIN,UA: NEGATIVE
RBC UA: NEGATIVE
Spec Grav, UA: 1.02 (ref 1.010–1.025)
UROBILINOGEN UA: 1 U/dL

## 2018-08-31 LAB — POCT URINE PREGNANCY: Preg Test, Ur: POSITIVE — AB

## 2018-08-31 NOTE — Progress Notes (Signed)
   SUBJECTIVE: Lindsey Morgan is a 27 y.o. female who complains of urinary frequency, urgency and dysuria x 2 weeks, without  fever, chills, or abnormal vaginal discharge or bleeding.  Presents today for UPT.  LMP: 03/01/2018. Pt also requested STI screening today.  OBJECTIVE: Appears well, in no apparent distress.  Vital signs are normal. Urine dipstick shows positive for nitrates, ketones and leukocytes.  Urine smelled like ammonia.  OB History    Gravida  3   Para  2   Term  0   Preterm  0   AB  0   Living  2     SAB  0   TAB  0   Ectopic  0   Multiple  0   Live Births  2         Home UPT Result: Unknown In-Office UPT result: Positive I have reviewed the patient's medical, obstetrical, social, and family histories, and medications.   ASSESSMENT:  Dysuria.  Positive pregnancy test. EDD: 12/06/2018 based off last LMP 03/01/18 (pt unsure of dates), GA: [redacted]w[redacted]d; [id number]. Fetal heart tones: 135 Fundal Height: 27 cm   PLAN Prenatal care to be completed at: [facility]. Continue PNV. GC/Chlamydia OB urine culture Treatment per orders.   Call or return to clinic prn if these symptoms worsen or fail to improve as anticipated. Verbal order given for MFM Complete +14 U/S by Raelyn Mora, CNM. Appt scheduled for 09/08/18 at 3:15 PM at [facility].   Clovis Pu, RN

## 2018-09-01 LAB — CERVICOVAGINAL ANCILLARY ONLY
BACTERIAL VAGINITIS: POSITIVE — AB
CANDIDA VAGINITIS: POSITIVE — AB
Chlamydia: POSITIVE — AB
NEISSERIA GONORRHEA: NEGATIVE
TRICH (WINDOWPATH): NEGATIVE

## 2018-09-02 ENCOUNTER — Telehealth: Payer: Self-pay | Admitting: *Deleted

## 2018-09-02 DIAGNOSIS — O98812 Other maternal infectious and parasitic diseases complicating pregnancy, second trimester: Principal | ICD-10-CM

## 2018-09-02 DIAGNOSIS — N760 Acute vaginitis: Secondary | ICD-10-CM

## 2018-09-02 DIAGNOSIS — B3731 Acute candidiasis of vulva and vagina: Secondary | ICD-10-CM

## 2018-09-02 DIAGNOSIS — B373 Candidiasis of vulva and vagina: Secondary | ICD-10-CM

## 2018-09-02 DIAGNOSIS — B9689 Other specified bacterial agents as the cause of diseases classified elsewhere: Secondary | ICD-10-CM

## 2018-09-02 DIAGNOSIS — A749 Chlamydial infection, unspecified: Secondary | ICD-10-CM

## 2018-09-02 MED ORDER — METRONIDAZOLE 500 MG PO TABS: 500.0000 mg | ORAL_TABLET | Freq: Two times a day (BID) | ORAL | 0 refills | Status: DC

## 2018-09-02 MED ORDER — AZITHROMYCIN 500 MG PO TABS
1000.0000 mg | ORAL_TABLET | Freq: Every day | ORAL | 0 refills | Status: DC
Start: 2018-09-02 — End: 2018-11-22

## 2018-09-02 MED ORDER — TERCONAZOLE 0.4 % VA CREA: 1.0000 | TOPICAL_CREAM | Freq: Every day | VAGINAL | 0 refills | Status: DC

## 2018-09-02 NOTE — Telephone Encounter (Signed)
Tried to call patient regarding test results. Unable to leave voice message. Medication sent for BV, yeast and chlamydia.

## 2018-09-02 NOTE — Telephone Encounter (Signed)
-----   Message from [place], [place] sent at 09/01/2018  9:52 PM EDT ----- This patient will need Rx for Flagyl 500 mg BID x 7 days (BV), Azithromax 1000 mg po all at once (CT), Terazol cream to be used after Flagyl course is complete

## 2018-09-04 LAB — CULTURE, OB URINE

## 2018-09-04 LAB — URINE CULTURE, OB REFLEX

## 2018-09-06 NOTE — Telephone Encounter (Signed)
Unable to leave voice message

## 2018-09-08 ENCOUNTER — Ambulatory Visit (HOSPITAL_COMMUNITY)
Admission: RE | Admit: 2018-09-08 | Discharge: 2018-09-08 | Disposition: A | Discharge status: Home / Self Care | Payer: Medicaid Other | Source: Ambulatory Visit | Attending: Obstetrics and Gynecology | Admitting: Obstetrics and Gynecology

## 2018-09-08 ENCOUNTER — Other Ambulatory Visit: Payer: Self-pay | Admitting: Obstetrics and Gynecology

## 2018-09-08 DIAGNOSIS — Z3A27 27 weeks gestation of pregnancy: Secondary | ICD-10-CM | POA: Diagnosis not present

## 2018-09-08 DIAGNOSIS — Z363 Encounter for antenatal screening for malformations: Secondary | ICD-10-CM

## 2018-09-08 DIAGNOSIS — O0932 Supervision of pregnancy with insufficient antenatal care, second trimester: Secondary | ICD-10-CM | POA: Diagnosis not present

## 2018-09-08 DIAGNOSIS — Z3480 Encounter for supervision of other normal pregnancy, unspecified trimester: Secondary | ICD-10-CM

## 2018-09-08 DIAGNOSIS — O09292 Supervision of pregnancy with other poor reproductive or obstetric history, second trimester: Secondary | ICD-10-CM

## 2018-09-09 ENCOUNTER — Other Ambulatory Visit (HOSPITAL_COMMUNITY): Payer: Self-pay | Admitting: *Deleted

## 2018-09-09 ENCOUNTER — Encounter: Payer: Medicaid Other | Admitting: Obstetrics and Gynecology

## 2018-09-09 DIAGNOSIS — Z362 Encounter for other antenatal screening follow-up: Secondary | ICD-10-CM

## 2018-09-10 ENCOUNTER — Other Ambulatory Visit: Payer: Self-pay | Admitting: *Deleted

## 2018-09-10 DIAGNOSIS — R8290 Unspecified abnormal findings in urine: Secondary | ICD-10-CM

## 2018-09-10 MED ORDER — CEPHALEXIN 500 MG PO CAPS: 500.0000 mg | ORAL_CAPSULE | Freq: Two times a day (BID) | ORAL | 0 refills | Status: DC

## 2018-09-15 ENCOUNTER — Telehealth: Payer: Self-pay | Admitting: General Practice

## 2018-09-15 NOTE — Telephone Encounter (Signed)
Patient called c/o of leg pain with a large vein that bulges.  Advised patient to go to Urgent Care and will route message to RN to speak with Provider.  Patient verbalized understanding.

## 2018-09-30 ENCOUNTER — Encounter: Payer: Medicaid Other | Admitting: Obstetrics and Gynecology

## 2018-10-04 ENCOUNTER — Encounter: Payer: Self-pay | Admitting: General Practice

## 2018-10-08 ENCOUNTER — Other Ambulatory Visit: Payer: Self-pay

## 2018-10-08 ENCOUNTER — Observation Stay
Admission: EM | Admit: 2018-10-08 | Discharge: 2018-10-09 | Disposition: A | Discharge status: Home / Self Care | Payer: Medicaid Other | Attending: Obstetrics and Gynecology | Admitting: Obstetrics and Gynecology

## 2018-10-08 ENCOUNTER — Encounter: Payer: Self-pay | Admitting: *Deleted

## 2018-10-08 DIAGNOSIS — O26893 Other specified pregnancy related conditions, third trimester: Principal | ICD-10-CM | POA: Insufficient documentation

## 2018-10-08 DIAGNOSIS — M545 Low back pain: Secondary | ICD-10-CM | POA: Diagnosis not present

## 2018-10-08 DIAGNOSIS — R51 Headache: Secondary | ICD-10-CM | POA: Insufficient documentation

## 2018-10-08 DIAGNOSIS — Z3A31 31 weeks gestation of pregnancy: Secondary | ICD-10-CM | POA: Diagnosis present

## 2018-10-08 DIAGNOSIS — O99353 Diseases of the nervous system complicating pregnancy, third trimester: Secondary | ICD-10-CM | POA: Diagnosis not present

## 2018-10-08 LAB — COMPREHENSIVE METABOLIC PANEL
ALK PHOS: 66 U/L (ref 38–126)
ALT: 18 U/L (ref 0–44)
ANION GAP: 4 — AB (ref 5–15)
AST: 24 U/L (ref 15–41)
Albumin: 2.6 g/dL — ABNORMAL LOW (ref 3.5–5.0)
BUN: 6 mg/dL (ref 6–20)
CO2: 26 mmol/L (ref 22–32)
Calcium: 8.6 mg/dL — ABNORMAL LOW (ref 8.9–10.3)
Chloride: 108 mmol/L (ref 98–111)
Creatinine, Ser: 0.62 mg/dL (ref 0.44–1.00)
GFR calc Af Amer: >60 mL/min (ref >60)
GFR calc non Af Amer: >60 mL/min (ref >60)
GLUCOSE: 82 mg/dL (ref 70–99)
POTASSIUM: 3.8 mmol/L (ref 3.5–5.1)
Sodium: 138 mmol/L (ref 135–145)
Total Bilirubin: 0.5 mg/dL (ref 0.3–1.2)
Total Protein: 6.1 g/dL — ABNORMAL LOW (ref 6.5–8.1)

## 2018-10-08 LAB — CBC
HEMATOCRIT: 30.4 % — AB (ref 36.0–46.0)
Hemoglobin: 9.8 g/dL — ABNORMAL LOW (ref 12.0–15.0)
MCH: 24 pg — AB (ref 26.0–34.0)
MCHC: 32.2 g/dL (ref 30.0–36.0)
MCV: 74.5 fL — AB (ref 80.0–100.0)
NRBC: 0 % (ref 0.0–0.2)
Platelets: 215 10*3/uL (ref 150–400)
RBC: 4.08 MIL/uL (ref 3.87–5.11)
RDW: 13.5 % (ref 11.5–15.5)
WBC: 7.4 10*3/uL (ref 4.0–10.5)

## 2018-10-08 LAB — PROTEIN / CREATININE RATIO, URINE
Creatinine, Urine: 108 mg/dL
Total Protein, Urine: <6 mg/dL

## 2018-10-08 MED ORDER — METOCLOPRAMIDE HCL 5 MG/ML IJ SOLN
10.0000 mg | Freq: Once | INTRAMUSCULAR | Status: AC
  Administered 2018-10-08: 10 mg via INTRAVENOUS
  Filled 2018-10-08: qty 2

## 2018-10-08 MED ORDER — BUTALBITAL-APAP-CAFFEINE 50-325-40 MG PO TABS
ORAL_TABLET | ORAL | Status: AC
  Administered 2018-10-08: 2 via ORAL
  Filled 2018-10-08: qty 2

## 2018-10-08 MED ORDER — PROMETHAZINE HCL 25 MG/ML IJ SOLN
25.0000 mg | Freq: Once | INTRAMUSCULAR | Status: AC
  Administered 2018-10-08: 25 mg via INTRAVENOUS
  Filled 2018-10-08: qty 1

## 2018-10-08 MED ORDER — DIPHENHYDRAMINE HCL 50 MG/ML IJ SOLN
25.0000 mg | Freq: Once | INTRAMUSCULAR | Status: AC
  Administered 2018-10-08: 25 mg via INTRAVENOUS
  Filled 2018-10-08: qty 1

## 2018-10-08 MED ORDER — BUTALBITAL-APAP-CAFFEINE 50-325-40 MG PO TABS
2.0000 | ORAL_TABLET | Freq: Once | ORAL | Status: AC
  Administered 2018-10-08: 2 via ORAL

## 2018-10-08 NOTE — OB Triage Note (Signed)
CO HA with dizziness since yesterday morning unrelieved by Tylenol. 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 Reports history of HA and migraines. Rates pain 7 out of 10. Denies epigastric pain or other visual disturbances. Elaina Hoops

## 2018-10-09 DIAGNOSIS — O26893 Other specified pregnancy related conditions, third trimester: Secondary | ICD-10-CM | POA: Diagnosis not present

## 2018-10-09 NOTE — Discharge Summary (Signed)
TRIAGE VISIT with NST   [name] is a 27 y.o. [id number]. She is at [redacted]w[redacted]d gestation.  Indication: Unrelieved headache, hx of migraines, low BP at work  S: Resting comfortably. no CTX, no VB. Active fetal movement. Denies headache, SOB, new onset swelling, RUQ pain, visual changes. O:  BP (!) 101/56   Pulse 77   Temp 98.1 F (36.7 C) (Oral)   Resp 16   Ht 5\' 5"  (1.651 m)   Wt 89.4 kg   LMP 03/01/2018 (Approximate)   BMI 32.78 kg/m  Results for orders placed or performed during the hospital encounter of 10/08/18 (from the past 48 hour(s))  Protein / creatinine ratio, urine   Collection Time: 10/08/18  7:05 PM  Result Value Ref Range   Creatinine, Urine 108 mg/dL   Total Protein, Urine <6 mg/dL   Protein Creatinine Ratio        0.00 - 0.15 mg/mg[Cre]  CBC   Collection Time: 10/08/18  7:07 PM  Result Value Ref Range   WBC 7.4 4.0 - 10.5 K/uL   RBC 4.08 3.87 - 5.11 MIL/uL   Hemoglobin 9.8 (L) 12.0 - 15.0 g/dL   HCT 30.8 (L) 65.7 - 84.6 %   MCV 74.5 (L) 80.0 - 100.0 fL   MCH 24.0 (L) 26.0 - 34.0 pg   MCHC 32.2 30.0 - 36.0 g/dL   RDW 96.2 95.2 - 84.1 %   Platelets 215 150 - 400 K/uL   nRBC 0.0 0.0 - 0.2 %  Comprehensive metabolic panel   Collection Time: 10/08/18  7:07 PM  Result Value Ref Range   Sodium 138 135 - 145 mmol/L   Potassium 3.8 3.5 - 5.1 mmol/L   Chloride 108 98 - 111 mmol/L   CO2 26 22 - 32 mmol/L   Glucose, Bld 82 70 - 99 mg/dL   BUN 6 6 - 20 mg/dL   Creatinine, Ser 3.24 0.44 - 1.00 mg/dL   Calcium 8.6 (L) 8.9 - 10.3 mg/dL   Total Protein 6.1 (L) 6.5 - 8.1 g/dL   Albumin 2.6 (L) 3.5 - 5.0 g/dL   AST 24 15 - 41 U/L   ALT 18 0 - 44 U/L   Alkaline Phosphatase 66 38 - 126 U/L   Total Bilirubin 0.5 0.3 - 1.2 mg/dL   GFR calc non Af Amer >60 >60 mL/min   GFR calc Af Amer >60 >60 mL/min   Anion gap 4 (L) 5 - 15     Gen: NAD, AAOx3      Abd: FNTTP      Ext: Non-tender, Nonedmeatous   FHT: moderate variability, +accel,s no decels TOCO:  quiet SVE:  deferred  NST: Category I strip, see detailed evaluation above  A/P:  27 y.o. [id number] [redacted]w[redacted]d with headache unrelieved by tylenol.                        Labor: not present.   R/o PreEclampsia: BP normal, labs reassuring  Headache: treated with iv meds, rest, IVF, now resolved  Fetal Wellbeing: Reassuring Cat 1 tracing.  D/c home stable, precautions reviewed, follow-up as scheduled.

## 2018-10-20 ENCOUNTER — Ambulatory Visit (HOSPITAL_COMMUNITY): Payer: Medicaid Other

## 2018-10-20 ENCOUNTER — Encounter (HOSPITAL_COMMUNITY): Payer: Self-pay

## 2018-11-21 ENCOUNTER — Inpatient Hospital Stay (HOSPITAL_COMMUNITY)
Admission: AD | Admit: 2018-11-21 | Discharge: 2018-11-22 | DRG: 807 | Disposition: A | Discharge status: Home / Self Care | Payer: Medicaid Other | Attending: Obstetrics & Gynecology | Admitting: Obstetrics & Gynecology

## 2018-11-21 ENCOUNTER — Other Ambulatory Visit: Payer: Self-pay

## 2018-11-21 ENCOUNTER — Encounter (HOSPITAL_COMMUNITY): Payer: Self-pay

## 2018-11-21 DIAGNOSIS — Z23 Encounter for immunization: Secondary | ICD-10-CM

## 2018-11-21 DIAGNOSIS — Z3483 Encounter for supervision of other normal pregnancy, third trimester: Secondary | ICD-10-CM | POA: Diagnosis present

## 2018-11-21 DIAGNOSIS — O0933 Supervision of pregnancy with insufficient antenatal care, third trimester: Secondary | ICD-10-CM

## 2018-11-21 DIAGNOSIS — Z3A37 37 weeks gestation of pregnancy: Secondary | ICD-10-CM

## 2018-11-21 DIAGNOSIS — O0930 Supervision of pregnancy with insufficient antenatal care, unspecified trimester: Secondary | ICD-10-CM

## 2018-11-21 LAB — TYPE AND SCREEN
ABO/RH(D): O POS
ANTIBODY SCREEN: NEGATIVE

## 2018-11-21 LAB — CBC
HCT: 35 % — ABNORMAL LOW (ref 36.0–46.0)
Hemoglobin: 11.1 g/dL — ABNORMAL LOW (ref 12.0–15.0)
MCH: 23.2 pg — ABNORMAL LOW (ref 26.0–34.0)
MCHC: 31.7 g/dL (ref 30.0–36.0)
MCV: 73.1 fL — ABNORMAL LOW (ref 80.0–100.0)
Platelets: 228 10*3/uL (ref 150–400)
RBC: 4.79 MIL/uL (ref 3.87–5.11)
RDW: 15.2 % (ref 11.5–15.5)
WBC: 4.5 10*3/uL (ref 4.0–10.5)
nRBC: 0 % (ref 0.0–0.2)

## 2018-11-21 LAB — RAPID HIV SCREEN (HIV 1/2 AB+AG)
HIV 1/2 Antibodies: NONREACTIVE
HIV-1 P24 Antigen - HIV24: NONREACTIVE

## 2018-11-21 LAB — HEPATITIS B SURFACE ANTIGEN: Hepatitis B Surface Ag: NEGATIVE

## 2018-11-21 MED ORDER — LACTATED RINGERS IV SOLN: 500.0000 mL | INTRAVENOUS | Status: DC | PRN

## 2018-11-21 MED ORDER — LIDOCAINE HCL (PF) 1 % IJ SOLN
INTRAMUSCULAR | Status: AC
  Filled 2018-11-21: qty 30

## 2018-11-21 MED ORDER — SIMETHICONE 80 MG PO CHEW: 80.0000 mg | CHEWABLE_TABLET | ORAL | Status: DC | PRN

## 2018-11-21 MED ORDER — OXYTOCIN BOLUS FROM INFUSION
500.0000 mL | Freq: Once | INTRAVENOUS | Status: AC
  Administered 2018-11-21: 500 mL via INTRAVENOUS

## 2018-11-21 MED ORDER — ONDANSETRON HCL 4 MG/2ML IJ SOLN: 4.0000 mg | INTRAMUSCULAR | Status: DC | PRN

## 2018-11-21 MED ORDER — INFLUENZA VAC SPLIT QUAD 0.5 ML IM SUSY
0.5000 mL | PREFILLED_SYRINGE | INTRAMUSCULAR | Status: AC
  Administered 2018-11-22: 0.5 mL via INTRAMUSCULAR

## 2018-11-21 MED ORDER — ACETAMINOPHEN 325 MG PO TABS: 650.0000 mg | ORAL_TABLET | ORAL | Status: DC | PRN

## 2018-11-21 MED ORDER — TETANUS-DIPHTH-ACELL PERTUSSIS 5-2.5-18.5 LF-MCG/0.5 IM SUSP
0.5000 mL | Freq: Once | INTRAMUSCULAR | Status: AC
  Administered 2018-11-22: 0.5 mL via INTRAMUSCULAR

## 2018-11-21 MED ORDER — ZOLPIDEM TARTRATE 5 MG PO TABS: 5.0000 mg | ORAL_TABLET | Freq: Every evening | ORAL | Status: DC | PRN

## 2018-11-21 MED ORDER — COCONUT OIL OIL: 1.0000 "application " | TOPICAL_OIL | Status: DC | PRN

## 2018-11-21 MED ORDER — WITCH HAZEL-GLYCERIN EX PADS: 1.0000 "application " | MEDICATED_PAD | CUTANEOUS | Status: DC | PRN

## 2018-11-21 MED ORDER — OXYTOCIN 40 UNITS IN LACTATED RINGERS INFUSION - SIMPLE MED
INTRAVENOUS | Status: AC
  Administered 2018-11-21: 500 mL via INTRAVENOUS
  Filled 2018-11-21: qty 1000

## 2018-11-21 MED ORDER — OXYCODONE-ACETAMINOPHEN 5-325 MG PO TABS: 1.0000 | ORAL_TABLET | ORAL | Status: DC | PRN

## 2018-11-21 MED ORDER — LACTATED RINGERS IV SOLN
INTRAVENOUS | Status: DC
  Administered 2018-11-21: 08:00:00 via INTRAVENOUS

## 2018-11-21 MED ORDER — LIDOCAINE HCL (PF) 1 % IJ SOLN
30.0000 mL | INTRAMUSCULAR | Status: DC | PRN
  Filled 2018-11-21: qty 30

## 2018-11-21 MED ORDER — OXYCODONE-ACETAMINOPHEN 5-325 MG PO TABS: 2.0000 | ORAL_TABLET | ORAL | Status: DC | PRN

## 2018-11-21 MED ORDER — DIBUCAINE 1 % RE OINT: 1.0000 "application " | TOPICAL_OINTMENT | RECTAL | Status: DC | PRN

## 2018-11-21 MED ORDER — SENNOSIDES-DOCUSATE SODIUM 8.6-50 MG PO TABS
2.0000 | ORAL_TABLET | ORAL | Status: DC
  Administered 2018-11-22: 2 via ORAL
  Filled 2018-11-21: qty 2

## 2018-11-21 MED ORDER — BENZOCAINE-MENTHOL 20-0.5 % EX AERO: 1.0000 "application " | INHALATION_SPRAY | CUTANEOUS | Status: DC | PRN

## 2018-11-21 MED ORDER — ONDANSETRON HCL 4 MG/2ML IJ SOLN: 4.0000 mg | Freq: Four times a day (QID) | INTRAMUSCULAR | Status: DC | PRN

## 2018-11-21 MED ORDER — OXYTOCIN 40 UNITS IN LACTATED RINGERS INFUSION - SIMPLE MED
2.5000 [IU]/h | INTRAVENOUS | Status: DC
  Administered 2018-11-21: 2.5 [IU]/h via INTRAVENOUS

## 2018-11-21 MED ORDER — DIPHENHYDRAMINE HCL 25 MG PO CAPS: 25.0000 mg | ORAL_CAPSULE | Freq: Four times a day (QID) | ORAL | Status: DC | PRN

## 2018-11-21 MED ORDER — IBUPROFEN 600 MG PO TABS
600.0000 mg | ORAL_TABLET | Freq: Four times a day (QID) | ORAL | Status: DC
  Administered 2018-11-21 – 2018-11-22 (×4): 600 mg via ORAL
  Filled 2018-11-21 (×6): qty 1

## 2018-11-21 MED ORDER — FLEET ENEMA 7-19 GM/118ML RE ENEM: 1.0000 | ENEMA | RECTAL | Status: DC | PRN

## 2018-11-21 MED ORDER — ONDANSETRON HCL 4 MG PO TABS: 4.0000 mg | ORAL_TABLET | ORAL | Status: DC | PRN

## 2018-11-21 MED ORDER — MEDROXYPROGESTERONE ACETATE 150 MG/ML IM SUSP
150.0000 mg | INTRAMUSCULAR | Status: AC | PRN
  Administered 2018-11-22: 150 mg via INTRAMUSCULAR
  Filled 2018-11-21: qty 1

## 2018-11-21 MED ORDER — SOD CITRATE-CITRIC ACID 500-334 MG/5ML PO SOLN: 30.0000 mL | ORAL | Status: DC | PRN

## 2018-11-21 MED ORDER — PRENATAL MULTIVITAMIN CH
1.0000 | ORAL_TABLET | Freq: Every day | ORAL | Status: DC
  Administered 2018-11-21 – 2018-11-22 (×2): 1 via ORAL
  Filled 2018-11-21 (×2): qty 1

## 2018-11-21 NOTE — Lactation Note (Signed)
This note was copied from a baby's chart. Lactation Consultation Note  Patient Name: Lindsey Morgan ZOXWR'UToday's Date: 11/21/2018 Reason for consult: Initial assessment;Early term 4737-38.6wks  11 hours old FT female who is being mostly formula feeding by her mother, she's a P3 and experienced BF. 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 Mom chose to formula feed this time, but had seconds thoughts, still deciding if she wants to BF some or fully formula feed. Praised mom for her efforts and reassured her that whatever feeding choice she decides; we're here to support her.   Mom has BCBS of [place] and she's going to get a DEBP through her insurance, LC recommended a couple of brands. [facility] and already knows how to hand express, when reviewing hand expression with mom, big drops of colostrum came off her right breast; LC rubbed them onto baby's mouth with mom's permission.  Baby was asleep in his bassinet when entering the room, offered assistance with latch and mom agreed to wake him up to feed. LC took baby STS to mom's right breast in cross cradle position (mom doing cradle first) and he was able to latch right away. Several audible swallows noted upon breast compressions, mom was very pleased. Baby had already fed some Gerber formula about an hour ago, he self-released from the breast at the 4 minutes mark. Mom kept him STS and LC assisted with extra blankets. Discussed the benefits of BF, pumping tips, lactogenesis II and cluster feeding.  Feeding plan:  1. Encouraged mom to feed baby STS 8-12 times/24 hours or sooner if feeding cues are present 2. Hand expression and spoon feeding was also encouraged 3. If mom decides to lean more towards formula, she'll follow  supplementation guidelines for formula feeding according to baby's age in hours  BF brochure, BF resources and feeding diary were reviewed. Mom reported all questions and concerns were answered, she's aware of LC services and will call PRN.    Maternal Data Formula Feeding for Exclusion: Yes Reason for exclusion: Mother's choice to formula feed on admision Has patient been taught Hand Expression?: Yes Does the patient have breastfeeding experience prior to this delivery?: Yes  Feeding Feeding Type: Breast Fed Nipple Type: Slow - flow  LATCH Score Latch: Grasps breast easily, tongue down, lips flanged, rhythmical sucking.  Audible Swallowing: A few with stimulation(with breast compressions, several ones)  Type of Nipple: Everted at rest and after stimulation  Comfort (Breast/Nipple): Soft / non-tender  Hold (Positioning): Assistance needed to correctly position infant at breast and maintain latch.(minimal assistance needed, just to switch from typical cradle to cross cradle position)  LATCH Score: 8  Interventions Interventions: Breast feeding basics reviewed;Assisted with latch;Skin to skin;Breast massage;Hand express;Breast compression;Position options;Support pillows;Adjust position  Lactation Tools Discussed/Used WIC Program: Yes   Consult Status Consult Status: PRN Date: 11/22/18 Follow-up type: In-patient    Lindsey Morgan Lindsey Morgan 11/21/2018, 8:10 PM

## 2018-11-21 NOTE — MAU Note (Addendum)
Lindsey Morgan is a 27 y.o. at 5269w6d here in MAU reporting: +conractions LOF: denies Vaginal bleeding: denies Arrived via EMS; saline locked IV noted in Left AC Pain score: 10/10. Desires an epidural Vitals:   11/21/18 0717 11/21/18 0734  BP: 125/70 130/65  Pulse: 73 64  Resp: 19 18  SpO2: 99%      FHT:110; +FM No Prenatal care; [facility].

## 2018-11-21 NOTE — Progress Notes (Signed)
Lindsey Dustmanatyana Faulk-Frink MRN: [id number]  Subjective: -Care assumed of 27 y.o. G3P2002 at 8947w6d who presents for active labor. Per H&P pregnancy history significant for no PNC and GBS unknown.  In room to meet acquaintance of patient and family.  Patient using nitrous for pain management and reports that she is coping well.  She denies urge to push.  Objective: BP 131/73   Pulse 80   Resp 18   Ht 5\' 5"  (1.651 m)   Wt 89.4 kg   LMP 03/01/2018 (Approximate)   SpO2 99%   BMI 32.78 kg/m  No intake/output data recorded. No intake/output data recorded.  Fetal Monitoring: FHT: 110 bpm, Mod Var, -Decels, +Accels UC: palpates moderate   Physical Exam: General appearance: alert, well appearing. Chest: normal rate and regular rhythm.  clear to auscultation, no wheezes, rales or rhonchi, symmetric air entry. Abdominal exam: Soft RT. Extremities: No edema Skin exam: Warm Dry  Vaginal Exam: SVE:   Dilation: 10 Effacement (%): 100 Station: -1 Exam by:: J.Kenshawn Maciolek, CNM Membranes:Intact-palpated  Internal Monitors: None  Augmentation/Induction: Pitocin:None Cytotec: None  Assessment:  IUP at 37.6wks Cat I FT  2nd Stage Labor No PNC GBS Unknown  Plan: -Expectant management at current -Anticipate SVD   Valma CavaJessica L Breelle Hollywood,MSN, CNM 11/21/2018, 8:35 AM

## 2018-11-21 NOTE — Discharge Summary (Addendum)
Postpartum Discharge Summary    Patient Name: Lindsey Morgan DOB: 10/09/1991 MRN: [id number]  Date of admission: 11/21/2018 Delivering Provider: Gerrit HeckEMLY, JESSICA   Date of discharge: 11/22/2018  Admitting diagnosis: 38 WKS CTX Intrauterine pregnancy: 3771w6d     Secondary diagnosis:  Principal Problem:   NSVD (normal spontaneous vaginal delivery) Active Problems:   Normal labor   No prenatal care in current pregnancy  Additional problems: none     Discharge diagnosis: Term Pregnancy Delivered                                                                                                Post partum procedures:None  Augmentation: None  Complications: None  Hospital course:  Onset of Labor With Vaginal Delivery     27 y.o. yo G3P1003 at 4171w6d was admitted in Active Labor on 11/21/2018. Patient had an uncomplicated labor course as follows:  Membrane Rupture Time/Date: 9:10 AM ,11/21/2018   Intrapartum Procedures: Episiotomy: None [1]                                         Lacerations:  None [1]  Patient had a delivery of a Viable infant. 11/21/2018  Information for the patient's newborn:  Mare LoanFaulk-Frink, Boy Mackie [[id number]][[id number]]  Delivery Method: Vag-Spont    Pateint had an uncomplicated postpartum course.  She is ambulating, tolerating a regular diet, passing flatus, and urinating well. Patient is discharged home in stable condition on 11/22/18.   Magnesium Sulfate recieved: No BMZ received: No  Physical exam  Vitals:   11/21/18 1532 11/21/18 1914 11/21/18 2330 11/22/18 0540  BP: (!) 120/47 127/66 119/71 115/63  Pulse: 73 65 72 67  Resp: 18 18 16 16   Temp: (!) 97.4 F (36.3 C) 98.5 F (36.9 C) 98 F (36.7 C) 97.8 F (36.6 C)  TempSrc: Oral Tympanic Oral Oral  SpO2:  100% 100% 100%  Weight:      Height:       General: alert, resting comfortably in bed, NAD Lochia: appropriate Uterine Fundus: firm Incision: N/A DVT Evaluation: No evidence of DVT seen on  physical exam. Labs: Lab Results  Component Value Date   WBC 4.5 11/21/2018   HGB 11.1 (L) 11/21/2018   HCT 35.0 (L) 11/21/2018   MCV 73.1 (L) 11/21/2018   PLT 228 11/21/2018   CMP Latest Ref Rng & Units 10/08/2018  Glucose 70 - 99 mg/dL 82  BUN 6 - 20 mg/dL 6  Creatinine 4.010.44 - 0.271.00 mg/dL 2.530.62  Sodium 664135 - 403145 mmol/L 138  Potassium 3.5 - 5.1 mmol/L 3.8  Chloride 98 - 111 mmol/L 108  CO2 22 - 32 mmol/L 26  Calcium 8.9 - 10.3 mg/dL 4.7(Q8.6(L)  Total Protein 6.5 - 8.1 g/dL 6.1(L)  Total Bilirubin 0.3 - 1.2 mg/dL 0.5  Alkaline Phos 38 - 126 U/L 66  AST 15 - 41 U/L 24  ALT 0 - 44 U/L 18    Discharge instruction: per After Visit Summary and "Baby and Me  Booklet".  After visit meds:  Allergies as of 11/22/2018      Reactions   Mushroom Extract Complex Swelling      Medication List    STOP taking these medications   azithromycin 500 MG tablet Commonly known as:  ZITHROMAX   cephALEXin 500 MG capsule Commonly known as:  KEFLEX   metroNIDAZOLE 500 MG tablet Commonly known as:  FLAGYL   terconazole 0.4 % vaginal cream Commonly known as:  TERAZOL 7     TAKE these medications   acetaminophen 325 MG tablet Commonly known as:  TYLENOL Take 2 tablets (650 mg total) by mouth every 4 (four) hours as needed (for pain scale < 4). What changed:    how much to take  when to take this  reasons to take this   ibuprofen 600 MG tablet Commonly known as:  ADVIL,MOTRIN Take 1 tablet (600 mg total) by mouth every 6 (six) hours.   medroxyPROGESTERone 150 MG/ML injection Commonly known as:  DEPO-PROVERA Inject 1 mL (150 mg total) into the muscle every 3 (three) months.   Prenatal Vitamins 28-0.8 MG Tabs Take 1 tablet by mouth daily.   senna-docusate 8.6-50 MG tablet Commonly known as:  Senokot-S Take 2 tablets by mouth daily. Start taking on:  November 23, 2018   simethicone 80 MG chewable tablet Commonly known as:  MYLICON Chew 1 tablet (80 mg total) by mouth as needed  for flatulence.       Diet: routine diet  Activity: Advance as tolerated. Pelvic rest for 6 weeks.   Outpatient follow up:4 weeks Follow up Appt:No future appointments. Follow up Visit:   Please schedule this patient for Postpartum visit in: 4 weeks with the following provider: MD for BTL PreOp discussion. For C/S patients schedule nurse incision check in weeks 2 weeks: N/A Low risk pregnancy complicated by: No PNC Delivery mode:  SVD Anticipated Birth Control:  Plans Interval BTL, papers signed with J.Emly, CNM on 11/21/18--Depo to be given at discharge PP Procedures needed: None  Schedule Integrated BH visit: no      Newborn Data: Live born female  Birth Weight: 6 lb 6 oz (2892 g) APGAR: 8, 9  Newborn Delivery   Birth date/time:  11/21/2018 09:10:00 Delivery type:  Vaginal, Spontaneous     Baby Feeding: Bottle and Breast Disposition:home with mother   11/22/2018 Henderson NewcomerBridgid H Wilson, MD  I confirm that I have verified the information documented in the resident's note and that I have also personally reperformed the physical exam and all medical decision making activities.  Rolm BookbinderCaroline M Siler Mavis, CNM 11/22/18 11:02 PM

## 2018-11-21 NOTE — H&P (Signed)
OBSTETRIC ADMISSION HISTORY AND PHYSICAL  Lindsey Morgan is a 27 y.o. female 523P0002 with IUP at 8737w6d by LMP presenting for spontaneous onset of labor. She reports +FMs, No LOF, no VB, no blurry vision, headaches or peripheral edema, and RUQ pain.  She plans on bottle feeding. She request Depo for birth control. She has had no prenatal care.  Dating: By LMP --->  Estimated Date of Delivery: 12/06/18  Sono:    @[redacted]w[redacted]d , CWD, normal anatomy, cephalic presentation, 1291g, 69%79% EFW   Prenatal History/Complications:  Past Medical History: Past Medical History:  Diagnosis Date  . Migraines     Past Surgical History: Past Surgical History:  Procedure Laterality Date  . WISDOM TOOTH EXTRACTION      Obstetrical History: OB History    Gravida  3   Para  2   Term  0   Preterm  0   AB  0   Living  2     SAB  0   TAB  0   Ectopic  0   Multiple  0   Live Births  2           Social History: Social History   Socioeconomic History  . Marital status: Single    Spouse name: Not on file  . Number of children: Not on file  . Years of education: Not on file  . Highest education level: Not on file  Occupational History  . Not on file  Social Needs  . Financial resource strain: Not on file  . Food insecurity:    Worry: Not on file    Inability: Not on file  . Transportation needs:    Medical: Not on file    Non-medical: Not on file  Tobacco Use  . Smoking status: Never Smoker  . Smokeless tobacco: Never Used  Substance and Sexual Activity  . Alcohol use: No  . Drug use: No  . Sexual activity: Yes    Birth control/protection: Surgical  Lifestyle  . Physical activity:    Days per week: Not on file    Minutes per session: Not on file  . Stress: Not on file  Relationships  . Social connections:    Talks on phone: Not on file    Gets together: Not on file    Attends religious service: Not on file    Active member of club or organization: Not on file    Attends meetings of clubs or organizations: Not on file    Relationship status: Not on file  Other Topics Concern  . Not on file  Social History Narrative   ** Merged History Encounter **        Family History: History reviewed. No pertinent family history.  Allergies: Allergies  Allergen Reactions  . Mushroom Extract Complex Swelling    Medications Prior to Admission  Medication Sig Dispense Refill Last Dose  . acetaminophen (TYLENOL) 325 MG tablet Take 1,000 mg by mouth every 6 (six) hours as needed for headache.     Marland Kitchen. azithromycin (ZITHROMAX) 500 MG tablet Take 2 tablets (1,000 mg total) by mouth daily. (Patient not taking: Reported on 10/08/2018) 2 tablet 0 Completed Course at Unknown time  . cephALEXin (KEFLEX) 500 MG capsule Take 1 capsule (500 mg total) by mouth 2 (two) times daily. (Patient not taking: Reported on 10/08/2018) 20 capsule 0 Completed Course at Unknown time  . metroNIDAZOLE (FLAGYL) 500 MG tablet Take 1 tablet (500 mg total) by mouth 2 (two) times  daily. (Patient not taking: Reported on 10/08/2018) 14 tablet 0 Completed Course at Unknown time  . Prenatal Vit-Fe Fumarate-FA (PRENATAL VITAMINS) 28-0.8 MG TABS Take 1 tablet by mouth daily. 30 tablet 3 10/08/2018 at Unknown time  . terconazole (TERAZOL 7) 0.4 % vaginal cream Place 1 applicator vaginally at bedtime. (Patient not taking: Reported on 10/08/2018) 45 g 0 Completed Course at Unknown time     Review of Systems   All systems reviewed and negative except as stated in HPI  Blood pressure 130/65, pulse 64, resp. rate 18, height 5\' 5"  (1.651 m), weight 89.4 kg, last menstrual period 03/01/2018, SpO2 99 %, not currently breastfeeding. General appearance: alert, cooperative and no distress Lungs: clear to auscultation bilaterally Heart: regular rate and rhythm Abdomen: soft, non-tender; bowel sounds normal Pelvic: n/a Extremities: Homans sign is negative, no sign of DVT DTR's +2 Presentation: cephalic Fetal  monitoringBaseline: 110 bpm, Variability: Good {> 6 bpm), Accelerations: Reactive and Decelerations: Absent Uterine activityFrequency: Every 3-4 minutes Dilation: Lip/rim Effacement (%): 90 Station: -1 Exam by:: Janeth Rasehristina Robinson RN; Ernestene KielNicole D. RN   Prenatal labs: All unknown ABO, Rh:   Antibody:   Rubella:   RPR:    HBsAg:    HIV:    GBS:      Prenatal Transfer Tool  Maternal Diabetes: unknown Genetic Screening: Declined Maternal Ultrasounds/Referrals: Normal Fetal Ultrasounds or other Referrals:  None Maternal Substance Abuse:  No Significant Maternal Medications:  None Significant Maternal Lab Results: None  No results found for this or any previous visit (from the past 24 hour(s)).  Patient Active Problem List   Diagnosis Date Noted  . Normal labor 11/21/2018  . Indication for care in labor or delivery 10/08/2018  . Normal labor and delivery 03/03/2017  . NSVD (normal spontaneous vaginal delivery) 03/03/2017  . Labor and delivery, indication for care 04/28/2015  . Supervision of normal pregnancy in third trimester 04/27/2015    Assessment/Plan:  Lindsey Morgan is a 27 y.o. G3P0002 at 4533w6d here for spontaneous onset of labor  #Labor: Expectant management #Pain: none #FWB: Cat 1 #ID:  GBS unknown, term no previous GBS #MOF: Bottle #MOC: Depo #Circ:  outpatient  Rolm BookbinderCaroline M , CNM  11/21/2018, 7:40 AM

## 2018-11-22 ENCOUNTER — Telehealth: Payer: Self-pay | Admitting: Family Medicine

## 2018-11-22 LAB — RUBELLA SCREEN: Rubella: 6.44 index (ref >0.99)

## 2018-11-22 LAB — RPR: RPR Ser Ql: NONREACTIVE

## 2018-11-22 MED ORDER — IBUPROFEN 600 MG PO TABS: 600.0000 mg | ORAL_TABLET | Freq: Four times a day (QID) | ORAL | 0 refills | Status: DC

## 2018-11-22 MED ORDER — ACETAMINOPHEN 325 MG PO TABS: 650.0000 mg | ORAL_TABLET | ORAL | 0 refills | Status: DC | PRN

## 2018-11-22 MED ORDER — MEDROXYPROGESTERONE ACETATE 150 MG/ML IM SUSP: 150.0000 mg | INTRAMUSCULAR | 0 refills | Status: DC

## 2018-11-22 MED ORDER — SIMETHICONE 80 MG PO CHEW: 80.0000 mg | CHEWABLE_TABLET | ORAL | 0 refills | Status: DC | PRN

## 2018-11-22 MED ORDER — SENNOSIDES-DOCUSATE SODIUM 8.6-50 MG PO TABS: 2.0000 | ORAL_TABLET | ORAL | 0 refills | Status: DC

## 2018-11-22 NOTE — Progress Notes (Signed)
CLINICAL SOCIAL WORK MATERNAL/CHILD NOTE  Patient Details  Name: Lindsey Morgan MRN: [id number] Date of Birth: 11/21/2016  Date:  11/22/2018  Clinical Social Worker Initiating Note:  Abundio Miu, [place]   Date/Time: Initiated:  11/22/18/1050             Child's Name:  First name still deciding, Astronomer Parents:  Mother, Father(Father - Lindsey Morgan)   Need for Interpreter:  None   Reason for Referral:  Late or No Prenatal Care    Address:  [place] [address]    Phone number:  [phone] (home)     Additional phone number:   Household Members/Support Persons (HM/SP):   Household Member/Support Person 1, Household Member/Support Person 2, Household Member/Support Person 3   HM/SP Name Relationship DOB or Age  HM/SP -[place] FOB 06-18-89  HM/SP -2 Lindsey Morgan daughter 04-28-15  HM/SP -3 Lindsey Morgan daughter 03-03-17  HM/SP -4     HM/SP -5     HM/SP -6     HM/SP -7     HM/SP -8       Natural Supports (not living in the home): Extended Family, Parent   Professional Supports:None   Employment:Full-time   Type of Work:   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  Education:      Homebound arranged:    Financial Resources:Medicaid   Other Resources: [place] Considerations Which May Impact Care:   Strengths: Ability to meet basic needs , Home prepared for child , Pediatrician chosen   Psychotropic Medications:         Pediatrician:    [place] area  Pediatrician List:   [facility] [place]     Pediatrician Fax Number:    Risk Factors/Current Problems: None   Cognitive State: Able to Concentrate , Alert , Linear Thinking , Insightful    Mood/Affect: Calm , Relaxed ,  Happy , Interested    CSW Assessment:CSW met with MOB at bedside regarding consult for no prenatal care, FOB present. MOB granted CSW verbal permission to ask FOB to leave during assessment, FOB left voluntarily. CSW introduced self and explained reason for consult. MOB was welcoming and pleasant throughout assessment. MOB reported that she resides with FOB and her two older children. MOB reported that FOB's family is local and supportive. MOB reported that her family is also supportive but they reside in [place], [place]. MOB reported that she recently applied for [facility] and has all essential items to care for baby.   CSW and MOB discussed MOB's prenatal care. MOB reported that she had very limited prenatal care due to finding out about pregnancy so far along and having difficulty establishing care with a practice. MOB reported that her work schedule also prevented her from going to her appointments and that she had 2 prenatal visits [facility] [employer] [facility] [employer] [facility]). CSW explained hospital drug policy, MOB verbalized understanding. CSW asked MOB about any substance use during pregnancy, MOB denied any substance use during pregnancy.   CSW inquired about MOB's mental health history, MOB denied any mental health history. MOB denied any history of postpartum depression. MOB presented calm and pleasant. MOB did not demonstrate any acute mental health signs/symptoms. CSW assessed for safety, MOB denied SI, HI and domestic violence.  CSW provided education regarding the baby blues period vs. perinatal mood disorders, discussed treatment and gave resources for mental health follow up if concerns arise.  CSW recommends self-evaluation during the postpartum time period using the New Mom Checklist from Postpartum Progress and encouraged MOB to contact a medical professional if symptoms are noted at any time.    CSW provided review of Sudden Infant Death Syndrome (SIDS) precautions.    CSW  identifies no further need for intervention and no barriers to discharge at this time.   CSW Plan/Description: No Further Intervention Required/No Barriers to Discharge, Sudden Infant Death Syndrome (SIDS) Education, Perinatal Mood and Anxiety Disorder (PMADs) Education, [place], CSW Will Continue to Monitor Umbilical Cord Tissue Drug Screen Results and Make Report if Barbette Or, LCSW 11/22/2018, 10:53 AM

## 2018-11-23 ENCOUNTER — Ambulatory Visit: Payer: Self-pay

## 2018-11-23 NOTE — Lactation Note (Addendum)
This note was copied from a baby's chart. Lactation Consultation Note  Patient Name: Lindsey Morgan WUJWJ'XToday's Date: 11/23/2018 Reason for consult: Follow-up assessment   P3, Baby 47 hours old.  Mother breastfed her first child for 3314 mos and 2nd child for 3 mos. 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  Discussed options and Federal Break Time for Nursing Law. Reviewed hand expression with good flow expressed. Asked mother is [facility] would [facility] breastfeeding and mother agreed. Observed feeding with swallows heard. Feed on demand approximately 8-12 times per day.   Pacifier use not recommended at this time.  Reviewed engorgement care and monitoring voids/stools. Encouraged breastfeeding before offering formula to help establish her milk supply.     Maternal Data Has patient been taught Hand Expression?: Yes  Feeding Feeding Type: Breast Fed Nipple Type: Slow - flow  LATCH Score Latch: Grasps breast easily, tongue down, lips flanged, rhythmical sucking.  Audible Swallowing: A few with stimulation  Type of Nipple: Everted at rest and after stimulation  Comfort (Breast/Nipple): Soft / non-tender  Hold (Positioning): Assistance needed to correctly position infant at breast and maintain latch.  LATCH Score: 8  Interventions Interventions: Assisted with latch;Breast feeding basics reviewed;Breast compression;Hand express  Lactation Tools Discussed/Used     Consult Status Consult Status: Complete Date: 11/23/18    Dahlia ByesBerkelhammer, Ruth Asante [facility] 11/23/2018, 8:41 AM

## 2018-12-07 NOTE — Telephone Encounter (Signed)
Scheduled appointment prior to patient discharge.

## 2018-12-20 ENCOUNTER — Telehealth: Payer: Self-pay | Admitting: Family Medicine

## 2018-12-20 NOTE — Telephone Encounter (Signed)
Called patient to let her know her appt was rescheduled do to the provider will not be, I was unable to leave a message

## 2018-12-23 ENCOUNTER — Ambulatory Visit: Payer: Medicaid Other | Admitting: Obstetrics and Gynecology

## 2018-12-27 ENCOUNTER — Encounter: Payer: Self-pay | Admitting: Family Medicine

## 2018-12-27 ENCOUNTER — Ambulatory Visit: Payer: Medicaid Other | Admitting: Obstetrics and Gynecology

## 2019-06-12 ENCOUNTER — Encounter (HOSPITAL_COMMUNITY): Payer: Self-pay

## 2019-06-12 ENCOUNTER — Other Ambulatory Visit: Payer: Self-pay

## 2019-06-12 ENCOUNTER — Ambulatory Visit (HOSPITAL_COMMUNITY)
Admission: EM | Admit: 2019-06-12 | Discharge: 2019-06-12 | Disposition: A | Discharge status: Home / Self Care | Payer: Medicaid Other | Attending: Internal Medicine | Admitting: Internal Medicine

## 2019-06-12 DIAGNOSIS — J020 Streptococcal pharyngitis: Secondary | ICD-10-CM | POA: Diagnosis not present

## 2019-06-12 DIAGNOSIS — R102 Pelvic and perineal pain: Secondary | ICD-10-CM

## 2019-06-12 DIAGNOSIS — J029 Acute pharyngitis, unspecified: Secondary | ICD-10-CM

## 2019-06-12 LAB — POCT URINALYSIS DIP (DEVICE)
Bilirubin Urine: NEGATIVE
Glucose, UA: NEGATIVE mg/dL
Ketones, ur: NEGATIVE mg/dL
Leukocytes,Ua: NEGATIVE
Nitrite: NEGATIVE
Protein, ur: NEGATIVE mg/dL
Specific Gravity, Urine: 1.02 (ref 1.005–1.030)
Urobilinogen, UA: 1 mg/dL (ref 0.0–1.0)
pH: 7 (ref 5.0–8.0)

## 2019-06-12 LAB — POCT RAPID STREP A: Streptococcus, Group A Screen (Direct): POSITIVE — AB

## 2019-06-12 MED ORDER — AMOXICILLIN 500 MG PO CAPS: 500.0000 mg | ORAL_CAPSULE | Freq: Two times a day (BID) | ORAL | 0 refills | Status: DC

## 2019-06-12 NOTE — ED Provider Notes (Signed)
[facility]    CSN: [id number] Arrival date & time: 06/12/19  1523     History   Chief Complaint Chief Complaint  Patient presents with  . Sore Throat  . Abdominal Pain    HPI Lindsey Morgan is a 28 y.o. female.   28 year old female comes in for 2-day history of sore throat and abdominal pain.  She is also having some mild congestion, postnasal drip.  She has mild cough, but feels like she is more clearing her throat.  She has chills without obvious fever or night sweats.  Denies shortness of breath, wheezing.  She has painful swallowing without trouble breathing, swelling of her throat, tripoding, drooling, trismus.  She has been having intermittent suprapubic pain that she does not know if it is related to current symptoms.  Pain is intermittent, not aggravated by oral intake, urine output, bowel movement.  She does not have any obvious aggravating or alleviating factor.  She is currently on her first cycle since vaginal birth in December.  LMP 06/09/2019.  States vaginal birth was uncomplicated.  She has nausea without vomiting.  Denies diarrhea.  She does have some constipation.  No urinary symptoms such as frequency, dysuria, hematuria.  Has not taken anything for the symptoms.     History reviewed. No pertinent past medical history.  There are no active problems to display for this patient.   History reviewed. No pertinent surgical history.  OB History   No obstetric history on file.      Home Medications    Prior to Admission medications   Medication Sig Start Date End Date Taking? Authorizing Provider  amoxicillin (AMOXIL) 500 MG capsule Take 1 capsule (500 mg total) by mouth 2 (two) times daily. 06/12/19   Belinda FisherYu, Amy V, PA-C    Family History History reviewed. No pertinent family history.  Social History Social History   Tobacco Use  . Smoking status: Not on file  Substance Use Topics  . Alcohol use: Not on file  . Drug use: Not on file     Allergies   Patient has no known allergies.   Review of Systems Review of Systems  Reason unable to perform ROS: See HPI as above.     Physical Exam Triage Vital Signs ED Triage Vitals [06/12/19 1554]  Enc Vitals Group     BP (!) 121/105     Pulse Rate (!) 112     Resp 18     Temp 99.5 F (37.5 C)     Temp Source Oral     SpO2 100 %     Weight      Height      Head Circumference      Peak Flow      Pain Score 8     Pain Loc      Pain Edu?      Excl. in GC?    No data found.  Updated Vital Signs BP (!) 121/105 (BP Location: Right Arm)   Pulse (!) 112   Temp 99.5 F (37.5 C) (Oral)   Resp 18   LMP 06/12/2019   SpO2 100%   Physical Exam Constitutional:      General: She is not in acute distress.    Appearance: Normal appearance. She is well-developed. She is not ill-appearing, toxic-appearing or diaphoretic.  HENT:     Head: Normocephalic and atraumatic.     Mouth/Throat:     Mouth: Mucous membranes are moist.  Pharynx: Oropharynx is clear. Uvula midline.     Tonsils: No tonsillar exudate. 2+ on the right. 2+ on the left.  Eyes:     Conjunctiva/sclera: Conjunctivae normal.     Pupils: Pupils are equal, round, and reactive to light.  Neck:     Musculoskeletal: Normal range of motion and neck supple.  Cardiovascular:     Rate and Rhythm: Normal rate and regular rhythm.     Heart sounds: Normal heart sounds. No murmur. No friction rub. No gallop.   Pulmonary:     Effort: Pulmonary effort is normal. No accessory muscle usage, prolonged expiration, respiratory distress or retractions.     Breath sounds: Normal breath sounds. No wheezing or rales.     Comments: Lungs clear to auscultation without adventitious lung sounds. Abdominal:     General: Bowel sounds are normal.     Palpations: Abdomen is soft.     Tenderness: There is abdominal tenderness in the suprapubic area. There is no right CVA tenderness, left CVA tenderness, guarding or rebound.  Skin:     General: Skin is warm and dry.  Neurological:     General: No focal deficit present.     Mental Status: She is alert and oriented to person, place, and time.  Psychiatric:        Behavior: Behavior normal.        Judgment: Judgment normal.      UC Treatments / Results  Labs (all labs ordered are listed, but only abnormal results are displayed) Labs Reviewed  POCT RAPID STREP A - Abnormal; Notable for the following components:      Result Value   Streptococcus, Group A Screen (Direct) POSITIVE (*)    All other components within normal limits  POCT URINALYSIS DIP (DEVICE) - Abnormal; Notable for the following components:   Hgb urine dipstick LARGE (*)    All other components within normal limits    EKG   Radiology No results found.  Procedures Procedures (including critical care time)  Medications Ordered in UC Medications - No data to display  Initial Impression / Assessment and Plan / UC Course  I have reviewed the triage vital signs and the nursing notes.  Pertinent labs & imaging results that were available during my care of the patient were reviewed by me and considered in my medical decision making (see chart for details).    Rapid strep positive.  Patient requested urine dipstick due to abdominal pain.  Negative for infection.  Will treat for strep with amoxicillin.  Discussed with patient, strep usually does not cause postnasal drip, nasal congestion.  We will continue to monitor symptoms.  If worsening postnasal drip, nasal congestion, cough despite antibiotic use, will need to be tested for COVID.  Currently no alarming signs to the abdominal pain, will continue to monitor.  Discussed monitoring bowel movement with possible constipation contributing to symptoms.  Return precautions given.  Patient expresses understanding and agrees to plan.  Final Clinical Impressions(s) / UC Diagnoses   Final diagnoses:  Sore throat  Suprapubic pain    ED Prescriptions     Medication Sig Dispense Auth. Provider   amoxicillin (AMOXIL) 500 MG capsule Take 1 capsule (500 mg total) by mouth 2 (two) times daily. 20 capsule Tobin Chad, [place] 06/12/19 1726

## 2019-06-12 NOTE — Discharge Instructions (Signed)
Rapid strep positive. Start antibiotics as directed. Tylenol/Motrin for fever and pain. Monitor for any worsening of symptoms, trouble breathing, trouble swallowing, swelling of the throat, leaning forward to breath, drooling, follow up here or at the emergency department for reevaluation.  Your urine was negative for infection.  As discussed, abdominal pain could be due to strep, also could be due to constipation.  At this time, push fluids and continue to monitor.  Follow-up with PCP for further evaluation if abdominal pain does not resolve despite finishing antibiotics.  As discussed, cannot rule out COVID given slight cough, runny nose, stuffy nose.  If symptoms greatly improve, and resolves with antibiotics within 48 hours, no need for further evaluation.  If continues to have runny nose, stuffy nose, cough after 48 hours on antibiotics, call office to schedule for COVID testing.

## 2019-06-12 NOTE — ED Triage Notes (Signed)
Pt C/O sore throat and abdominal pain. Symptoms started two days ago. Pt states she is having difficulty swallowing and on the left side of her throat its very painful.

## 2019-06-13 ENCOUNTER — Encounter (HOSPITAL_COMMUNITY): Payer: Self-pay

## 2019-10-01 ENCOUNTER — Other Ambulatory Visit: Payer: Self-pay

## 2019-10-01 DIAGNOSIS — Z20822 Contact with and (suspected) exposure to covid-19: Secondary | ICD-10-CM

## 2019-10-02 LAB — NOVEL CORONAVIRUS, NAA: SARS-CoV-2, NAA: NOT DETECTED

## 2019-12-12 ENCOUNTER — Other Ambulatory Visit: Payer: Self-pay

## 2019-12-12 DIAGNOSIS — Z20822 Contact with and (suspected) exposure to covid-19: Secondary | ICD-10-CM

## 2019-12-14 LAB — NOVEL CORONAVIRUS, NAA: SARS-CoV-2, NAA: NOT DETECTED

## 2020-01-06 ENCOUNTER — Other Ambulatory Visit: Payer: Self-pay | Admitting: Physician Assistant

## 2020-01-06 DIAGNOSIS — R9431 Abnormal electrocardiogram [ECG] [EKG]: Secondary | ICD-10-CM

## 2020-01-12 ENCOUNTER — Ambulatory Visit: Payer: Self-pay | Admitting: Cardiology

## 2020-01-20 ENCOUNTER — Other Ambulatory Visit: Payer: Self-pay

## 2020-01-20 ENCOUNTER — Encounter: Payer: Self-pay | Admitting: Cardiology

## 2020-01-20 ENCOUNTER — Ambulatory Visit: Payer: Medicaid Other | Admitting: Cardiology

## 2020-01-20 VITALS — BP 117/76 | HR 85 | Temp 98.1°F | Ht 65.0 in | Wt 211.0 lb

## 2020-01-20 DIAGNOSIS — E669 Obesity, unspecified: Secondary | ICD-10-CM

## 2020-01-20 DIAGNOSIS — I440 Atrioventricular block, first degree: Secondary | ICD-10-CM

## 2020-01-20 DIAGNOSIS — R002 Palpitations: Secondary | ICD-10-CM

## 2020-01-20 NOTE — Progress Notes (Signed)
Primary Physician/Referring:  Julian Hy, PA-C  Patient ID: Lindsey Morgan, female    DOB: 1991-04-01, 29 y.o.   MRN: [id number]  Chief Complaint  Patient presents with  . av block  . New Patient (Initial Visit)   HPI:    Lindsey Morgan  is a 29 y.o. with history of ocular migraines, referred to [place] for evaluation of first degree AV block.  She has history of migraines that she only has approximately once a year.   She has steadily been gaining weight since having children and has been unable to lose weight after her 3rd child. She was recently seen at [facility] center for potentially starting weight loss medications, in which EKG was performed showing first degree AV block.   She essentially asymptomatic. She has rare episodes of palpitations that she is not necessarily bothered by. She is fairly active with doing some walking and using some home exercises.  Paternal grandmother had MI at age 59.   No history of tobacco use, occasional alcohol use, no illicit drug use.   Past Medical History:  Diagnosis Date  . Migraines    Past Surgical History:  Procedure Laterality Date  . WISDOM TOOTH EXTRACTION     Social History   Tobacco Use  . Smoking status: Never Smoker  . Smokeless tobacco: Never Used  Substance Use Topics  . Alcohol use: Yes    Comment: occ    ROS  Review of Systems  Constitution: Negative for decreased appetite, malaise/fatigue, weight gain and weight loss.  Eyes: Negative for visual disturbance.  Cardiovascular: Negative for chest pain, claudication, dyspnea on exertion, leg swelling, orthopnea, palpitations and syncope.  Respiratory: Negative for hemoptysis and wheezing.   Endocrine: Negative for cold intolerance and heat intolerance.  Hematologic/Lymphatic: Does not bruise/bleed easily.  Skin: Negative for nail changes.  Musculoskeletal: Negative for muscle weakness and myalgias.  Gastrointestinal: Negative for abdominal  pain, change in bowel habit, nausea and vomiting.  Neurological: Negative for difficulty with concentration, dizziness, focal weakness and headaches.  Psychiatric/Behavioral: Negative for altered mental status and suicidal ideas.  All other systems reviewed and are negative.  Objective  Blood pressure 117/76, pulse 85, temperature 98.1 F (36.7 C), height 5\' 5"  (1.651 m), weight 211 lb (95.7 kg), SpO2 100 %, unknown if currently breastfeeding.  Vitals with BMI 01/20/2020 06/12/2019 06/12/2019  Height 5\' 5"  - -  Weight 211 lbs - -  BMI 02.77 - -  Systolic 412 878 676  Diastolic 76 85 720  Pulse 85 - 112    Physical Exam  Constitutional: She is oriented to person, place, and time. Vital signs are normal. She appears well-developed and well-nourished.  HENT:  Head: Normocephalic and atraumatic.  Cardiovascular: Normal rate, regular rhythm, normal heart sounds and intact distal pulses.  Pulmonary/Chest: Effort normal and breath sounds normal. No accessory muscle usage. No respiratory distress.  Abdominal: Soft. Bowel sounds are normal.  Musculoskeletal:        General: Normal range of motion.     Cervical back: Normal range of motion.  Neurological: She is alert and oriented to person, place, and time.  Skin: Skin is warm and dry.  Vitals reviewed.  Laboratory examination:   No results for input(s): NA, K, CL, CO2, GLUCOSE, BUN, CREATININE, CALCIUM, GFRNONAA, GFRAA in the last 8760 hours. CrCl cannot be calculated (Patient's most recent lab result is older than the maximum 21 days allowed.).  CMP Latest Ref Rng & Units 10/08/2018 03/16/2015 12/13/2014  Glucose 70 - 99 mg/dL 82 91 76  BUN 6 - 20 mg/dL 6 6 6   Creatinine 0.44 - 1.00 mg/dL 4.97 0.26  Sodium 135 - 145 mmol/L 138 130(L) 134(L)  Potassium 3.5 - 5.1 mmol/L 3.8 3.3(L) 3.5  Chloride 98 - 111 mmol/L 108 104 103  CO2 22 - 32 mmol/L 26 21(L) 23  Calcium 8.9 - 10.3 mg/dL 3.78) 7.8(L) 8.4  Total Protein 6.5 - 8.1 g/dL 6.1(L)  5.7(L) -  Total Bilirubin 0.3 - 1.2 mg/dL 0.5 1.1 -  Alkaline Phos 38 - 126 U/L 66 63 -  AST 15 - 41 U/L 24 23 -  ALT 0 - 44 U/L 18 13(L) -   CBC Latest Ref Rng & Units 11/21/2018 10/08/2018 03/04/2017  WBC 4.0 - 10.5 K/uL 4.5 7.4 8.3  Hemoglobin 12.0 - 15.0 g/dL 11.1(L) 9.8(L) 11.3(L)  Hematocrit 36.0 - 46.0 % 35.0(L) 30.4(L) 33.6(L)  Platelets 150 - 400 K/uL 228 215 225   Lipid Panel  No results found for: CHOL, TRIG, HDL, CHOLHDL, VLDL, LDLCALC, LDLDIRECT HEMOGLOBIN A1C No results found for: HGBA1C, MPG TSH No results for input(s): TSH in the last 8760 hours.  External labs : 12/31/2019: RBC 5.56, normal H&H, microcytic indices, platelets normal.    Medications and allergies   Allergies  Allergen Reactions  . Mushroom Extract Complex Swelling     Current Outpatient Medications  Medication Instructions  . Multiple Vitamins-Calcium (ONE-A-DAY WOMENS FORMULA PO) Oral  . topiramate (TOPAMAX) 25 mg, Oral, 2 times daily    Radiology:  No results found.  Cardiac Studies:   Echo 01/04/2020:  Normal study. Normal LVEF at 60-65%. Normal LV size and wall thickness. No valvular abnormalities.   Assessment     ICD-10-CM   1. First degree AV block  I44.0 EKG 12-Lead  2. Palpitations  R00.2   3. Obesity (BMI 35.0-39.9 without comorbidity)  E66.9     EKG 02/29/2021: Normal sinus rhythm at 83 bpm with first degree AV block, normal axis, no evidence of ischemia.   No orders of the defined types were placed in this encounter.   Medications Discontinued During This Encounter  Medication Reason  . acetaminophen (TYLENOL) 325 MG tablet Error  . amoxicillin (AMOXIL) 500 MG capsule Error  . ibuprofen (ADVIL,MOTRIN) 600 MG tablet Error  . medroxyPROGESTERone (DEPO-PROVERA) 150 MG/ML injection Error  . Prenatal Vit-Fe Fumarate-FA (PRENATAL VITAMINS) 28-0.8 MG TABS Error  . senna-docusate (SENOKOT-S) 8.6-50 MG tablet Error  . simethicone (MYLICON) 80 MG chewable tablet Error      Recommendations:   Patient with history of migraines, referred to [birth year]-09-1979 for evaluation of first-degree AV block.  I have explained first-degree AV block to the patient, generally benign finding.  She has had echocardiogram performed at [facility], I currently do not have records of this.  Will obtain this for our review.  If no structural abnormalities are noted, she may proceed with starting weight loss therapy as I see no contraindication for starting therapy at this time.  Do not feel that she needs stress testing.  She is able to exercise without any exertional difficulty and is low risk for CAD.  She does have occasional rare episodes of palpitations that are suggestive of PACs/PVCs.  I have advised her that phentermine may make her palpitations worse and she should notify [facility] if she should begin having worsening palpitations.  She will need to continue with diet and exercise to help with weight loss.  We will plan to  see her back in 4 weeks after starting weight loss medications for reevaluation and tolerance, and if tolerating well will consider as needed follow-up.  *Addendum: I reviewed patient's echocardiogram, echocardiogram is essentially normal.  See no contraindication to her starting weight loss therapy.  We will continue with follow-up in weeks as planned.   *I have discussed this case with Dr. Odis Hollingshead and he personally examined the patient and participated in formulating the plan.*   Toniann Fail, MSN, APRN, FNP-C [facility] Office: [phone] Fax: [phone]

## 2020-02-17 ENCOUNTER — Encounter: Payer: Self-pay | Admitting: Cardiology

## 2020-02-17 ENCOUNTER — Other Ambulatory Visit: Payer: Self-pay

## 2020-02-17 ENCOUNTER — Ambulatory Visit: Payer: Medicaid Other | Admitting: Cardiology

## 2020-02-17 ENCOUNTER — Ambulatory Visit: Payer: Medicaid Other

## 2020-02-17 VITALS — BP 119/76 | HR 98 | Temp 97.9°F | Ht 65.0 in | Wt 198.0 lb

## 2020-02-17 DIAGNOSIS — R002 Palpitations: Secondary | ICD-10-CM

## 2020-02-17 DIAGNOSIS — E6609 Other obesity due to excess calories: Secondary | ICD-10-CM

## 2020-02-17 DIAGNOSIS — I440 Atrioventricular block, first degree: Secondary | ICD-10-CM

## 2020-02-17 NOTE — Progress Notes (Signed)
Primary Physician/Referring:  Julian Hy, PA-C  Patient ID: Lindsey Morgan, female    DOB: February 14, 1991, 29 y.o.   MRN: [id number]  Chief Complaint  Patient presents with  . Palpitations    follow up  . Abnormal ECG   HPI:    Lindsey Morgan  is a 29 y.o. with history of ocular migraines who presents to the office for follow-up in regards to palpitations and first-degree AV block.  Patient was initially seen in consultation for evaluation of first-degree AV block in the setting of being considered for pharmacological therapy for weight loss.  She had an echocardiogram performed in outside facility which reported her preserved left ventricular systolic function without any significant valvular heart disease.  In the interim, patient has started on phentermine and already was on Topamax.  Since last time she was in the office patient has lost 14 pounds with the use of pharmacological therapy and dietary changes.  Patient states that she is running 3 times per week without any symptoms of angina, anginal equivalent, near syncope or syncope.    Palpitations: She states that the palpitations are seeming intensity and frequency since last time she was seen in the office.  There is no improving or worsening factors.  Patient has not taken any pharmacological therapy for her palpitations.  She denies use of excessive alcohol, energy drink consumption, caffeine beverages, no herbal supplements.    Paternal grandmother had MI at age 84.   No history of tobacco use, occasional alcohol use, no illicit drug use.   Past Medical History:  Diagnosis Date  . Migraines    Past Surgical History:  Procedure Laterality Date  . WISDOM TOOTH EXTRACTION     Social History   Tobacco Use  . Smoking status: Never Smoker  . Smokeless tobacco: Never Used  Substance Use Topics  . Alcohol use: Yes    Comment: occ    ROS  Review of Systems  Constitution: Negative for decreased appetite,  malaise/fatigue, weight gain and weight loss.  Eyes: Negative for visual disturbance.  Cardiovascular: Negative for chest pain, claudication, dyspnea on exertion, leg swelling, orthopnea, palpitations and syncope.  Respiratory: Negative for hemoptysis and wheezing.   Endocrine: Negative for cold intolerance and heat intolerance.  Hematologic/Lymphatic: Does not bruise/bleed easily.  Skin: Negative for nail changes.  Musculoskeletal: Negative for muscle weakness and myalgias.  Gastrointestinal: Negative for abdominal pain, change in bowel habit, nausea and vomiting.  Neurological: Negative for difficulty with concentration, dizziness, focal weakness and headaches.  Psychiatric/Behavioral: Negative for altered mental status and suicidal ideas.  All other systems reviewed and are negative.  Objective  Blood pressure 119/76, pulse 98, temperature 97.9 F (36.6 C), height 5\' 5"  (1.651 m), weight 198 lb (89.8 kg), SpO2 98 %, unknown if currently breastfeeding.  Vitals with BMI 02/17/2020 01/20/2020 06/12/2019  Height 5\' 5"  5\' 5"  -  Weight 198 lbs 211 lbs -  BMI 28.31 51.76 -  Systolic 160 737 106  Diastolic 76 76 85  Pulse 98 85 -    Physical Exam  Constitutional: She is oriented to person, place, and time. Vital signs are normal. She appears well-developed and well-nourished.  HENT:  Head: Normocephalic and atraumatic.  Cardiovascular: Normal rate, regular rhythm, normal heart sounds and intact distal pulses.  Pulmonary/Chest: Effort normal and breath sounds normal. No accessory muscle usage. No respiratory distress.  Abdominal: Soft. Bowel sounds are normal.  Musculoskeletal:        General: Normal range of motion.  Cervical back: Normal range of motion.  Neurological: She is alert and oriented to person, place, and time.  Skin: Skin is warm and dry.  Vitals reviewed.  Laboratory examination:   No results for input(s): NA, K, CL, CO2, GLUCOSE, BUN, CREATININE, CALCIUM, GFRNONAA,  GFRAA in the last 8760 hours. CrCl cannot be calculated (Patient's most recent lab result is older than the maximum 21 days allowed.).  CMP Latest Ref Rng & Units 10/08/2018 03/16/2015 12/13/2014  Glucose 70 - 99 mg/dL 82 91 76  BUN 6 - 20 mg/dL 6 6 6   Creatinine 0.44 - 1.00 mg/dL 9.02 4.09  Sodium 135 - 145 mmol/L 138 130(L) 134(L)  Potassium 3.5 - 5.1 mmol/L 3.8 3.3(L) 3.5  Chloride 98 - 111 mmol/L 108 104 103  CO2 22 - 32 mmol/L 26 21(L) 23  Calcium 8.9 - 10.3 mg/dL 7.35) 7.8(L) 8.4  Total Protein 6.5 - 8.1 g/dL 6.1(L) 5.7(L) -  Total Bilirubin 0.3 - 1.2 mg/dL 0.5 1.1 -  Alkaline Phos 38 - 126 U/L 66 63 -  AST 15 - 41 U/L 24 23 -  ALT 0 - 44 U/L 18 13(L) -   CBC Latest Ref Rng & Units 11/21/2018 10/08/2018 03/04/2017  WBC 4.0 - 10.5 K/uL 4.5 7.4 8.3  Hemoglobin 12.0 - 15.0 g/dL 11.1(L) 9.8(L) 11.3(L)  Hematocrit 36.0 - 46.0 % 35.0(L) 30.4(L) 33.6(L)  Platelets 150 - 400 K/uL 228 215 225   External labs : 12/31/2019: RBC 5.56, normal H&H, microcytic indices, platelets normal.    Medications and allergies   Allergies  Allergen Reactions  . Mushroom Extract Complex Swelling     Current Outpatient Medications  Medication Instructions  . Multiple Vitamins-Calcium (ONE-A-DAY WOMENS FORMULA PO) Oral  . phentermine 37.5 mg, Oral, BH-each morning  . topiramate (TOPAMAX) 25 mg, Oral, 2 times daily    Radiology:  No results found.  Cardiac Studies:   EKG: 02/29/2021: Normal sinus rhythm at 83 bpm with first degree AV block, normal axis, no evidence of ischemia.  02/17/2020: Normal sinus rhythm with a ventricular rate of 90 bpm, normal axis, no evidence of underlying ischemia or injury pattern.  Compared to prior ECG first-degree AV block is resolved.  Echo: 01/04/2020:  Normal study. Normal LVEF at 60-65%. Normal LV size and wall thickness. No valvular abnormalities.   Assessment     ICD-10-CM   1. Palpitations  R00.2 EKG 12-Lead    HOLTER MONITOR - 48 HOUR  2. First  degree AV block  I44.0   3. Class 1 obesity due to excess calories without serious comorbidity with body mass index (BMI) of 32.0 to 32.9 in adult  E66.09    Z68.32    There are no discontinued medications.   Recommendations:   Lindsey Morgan is a 29 y.o. female without significant past cardiac history presents to the office after the initiation of phentermine and continued palpitations.  Palpitations:  EKG shows normal sinus rhythm with resolution of first-degree AV block without underlying arrhythmia and/or evidence of myocardial ischemia/injury pattern.  Echocardiogram from outside facility reported preserved left ventricular systolic function without any significant valvular heart disease.  Patient has started phentermine and states that her palpitations continue to be the same without any significant increases or additional symptoms.  Recommend a 48-hour Holter to rule out any arrhythmias.  If a Holter monitor is unremarkable would recommend a 29-month follow-up after she has stopped phentermine.  If the Holter monitor is considered abnormal recommend sooner follow-up.  Patient is agreeable with the current plan of care.  Obesity, due to excess calories  Body mass index is 32.95 kg/m.   Patient is congratulated on the weight loss that she has accomplished since last office visit.  She is working with her primary care provider on weight loss which includes lifestyle modifications and pharmacological therapy.  Patient would like to use phentermine for a short period of time to help with weight loss and plans to discontinue in the next 1 or 2 months.  I reviewed with the patient the importance of diet, regular physical activity/exercise, weight loss.    Patient is educated on increasing physical activity gradually as tolerated.  With the goal of moderate intensity exercise for 30 minutes a day 5 days a week.   Orders Placed This Encounter  Procedures  . HOLTER  MONITOR - 48 HOUR  . EKG 12-Lead   --Continue cardiac medications as reconciled in final medication list. --Return in about 8 weeks (around 04/13/2020) for palpitations s/p phentermine and topamax use.. Or sooner if needed. --Continue follow-up with your primary care physician regarding the management of your other chronic comorbid conditions.  Patient's questions and concerns were addressed to her satisfaction. She voices understanding of the instructions provided during this encounter.   This note was created using a voice recognition software as a result there may be grammatical errors inadvertently enclosed that do not reflect the nature of this encounter. Every attempt is made to correct such errors.  Tessa Lerner, DO, [facility] Office: [phone]

## 2020-02-19 ENCOUNTER — Encounter: Payer: Self-pay | Admitting: Cardiology

## 2020-04-12 ENCOUNTER — Ambulatory Visit: Payer: Medicaid Other

## 2020-04-13 ENCOUNTER — Ambulatory Visit: Payer: Medicaid Other | Admitting: Cardiology

## 2020-05-27 ENCOUNTER — Emergency Department (HOSPITAL_COMMUNITY)
Admission: EM | Admit: 2020-05-27 | Discharge: 2020-05-28 | Disposition: A | Discharge status: Home / Self Care | Payer: Medicaid Other | Attending: Emergency Medicine | Admitting: Emergency Medicine

## 2020-05-27 ENCOUNTER — Other Ambulatory Visit: Payer: Self-pay

## 2020-05-27 DIAGNOSIS — O99891 Other specified diseases and conditions complicating pregnancy: Secondary | ICD-10-CM | POA: Diagnosis not present

## 2020-05-27 DIAGNOSIS — Z3490 Encounter for supervision of normal pregnancy, unspecified, unspecified trimester: Secondary | ICD-10-CM

## 2020-05-27 DIAGNOSIS — R609 Edema, unspecified: Secondary | ICD-10-CM

## 2020-05-27 DIAGNOSIS — Z3A00 Weeks of gestation of pregnancy not specified: Secondary | ICD-10-CM | POA: Diagnosis not present

## 2020-05-27 DIAGNOSIS — R600 Localized edema: Secondary | ICD-10-CM | POA: Diagnosis not present

## 2020-05-28 ENCOUNTER — Encounter (HOSPITAL_COMMUNITY): Payer: Self-pay | Admitting: Emergency Medicine

## 2020-05-28 ENCOUNTER — Other Ambulatory Visit: Payer: Self-pay

## 2020-05-28 ENCOUNTER — Emergency Department (HOSPITAL_COMMUNITY): Payer: Medicaid Other

## 2020-05-28 LAB — CBC
HCT: 35.4 % — ABNORMAL LOW (ref 36.0–46.0)
Hemoglobin: 11.5 g/dL — ABNORMAL LOW (ref 12.0–15.0)
MCH: 25 pg — ABNORMAL LOW (ref 26.0–34.0)
MCHC: 32.5 g/dL (ref 30.0–36.0)
MCV: 77 fL — ABNORMAL LOW (ref 80.0–100.0)
Platelets: 199 10*3/uL (ref 150–400)
RBC: 4.6 MIL/uL (ref 3.87–5.11)
RDW: 13.7 % (ref 11.5–15.5)
WBC: 5.2 10*3/uL (ref 4.0–10.5)
nRBC: 0 % (ref 0.0–0.2)

## 2020-05-28 LAB — HEPATIC FUNCTION PANEL
ALT: 22 U/L (ref 0–44)
AST: 32 U/L (ref 15–41)
Albumin: 2.6 g/dL — ABNORMAL LOW (ref 3.5–5.0)
Alkaline Phosphatase: 32 U/L — ABNORMAL LOW (ref 38–126)
Bilirubin, Direct: <0.1 mg/dL (ref 0.0–0.2)
Total Bilirubin: 0.6 mg/dL (ref 0.3–1.2)
Total Protein: 5.1 g/dL — ABNORMAL LOW (ref 6.5–8.1)

## 2020-05-28 LAB — BASIC METABOLIC PANEL
Anion gap: 8 (ref 5–15)
BUN: 11 mg/dL (ref 6–20)
CO2: 22 mmol/L (ref 22–32)
Calcium: 8.9 mg/dL (ref 8.9–10.3)
Chloride: 109 mmol/L (ref 98–111)
Creatinine, Ser: 0.97 mg/dL (ref 0.44–1.00)
GFR calc Af Amer: >60 mL/min (ref >60)
GFR calc non Af Amer: >60 mL/min (ref >60)
Glucose, Bld: 88 mg/dL (ref 70–99)
Potassium: 3.9 mmol/L (ref 3.5–5.1)
Sodium: 139 mmol/L (ref 135–145)

## 2020-05-28 LAB — URINALYSIS, ROUTINE W REFLEX MICROSCOPIC
Bacteria, UA: NONE SEEN
Bilirubin Urine: NEGATIVE
Glucose, UA: NEGATIVE mg/dL
Hgb urine dipstick: NEGATIVE
Ketones, ur: 20 mg/dL — AB
Nitrite: NEGATIVE
Protein, ur: NEGATIVE mg/dL
Specific Gravity, Urine: 1.013 (ref 1.005–1.030)
pH: 6 (ref 5.0–8.0)

## 2020-05-28 LAB — HCG, QUANTITATIVE, PREGNANCY: hCG, Beta Chain, Quant, S: 8638 m[IU]/mL — ABNORMAL HIGH (ref <5)

## 2020-05-28 LAB — TROPONIN I (HIGH SENSITIVITY)
Troponin I (High Sensitivity): 2 ng/L (ref <18)
Troponin I (High Sensitivity): 3 ng/L (ref <18)

## 2020-05-28 LAB — I-STAT BETA HCG BLOOD, ED (MC, WL, AP ONLY): I-stat hCG, quantitative: >2000 m[IU]/mL — ABNORMAL HIGH (ref <5)

## 2020-05-28 MED ORDER — SODIUM CHLORIDE 0.9% FLUSH: 3.0000 mL | Freq: Once | INTRAVENOUS | Status: DC

## 2020-05-28 MED ORDER — PRENATAL 27-0.8 MG PO TABS: 1.0000 | ORAL_TABLET | Freq: Every day | ORAL | 0 refills | Status: AC

## 2020-05-28 NOTE — ED Provider Notes (Signed)
Received signout at the beginning of shift.  Patient presents complaining of bilateral leg swelling.  No chest pain or shortness of breath.  No prior history of DVT.  No fever or chills.  Incidentally patient was found to be positive for pregnancy with beta hCG of 8638.  Labs remarkable for an albumin of 2.6 which may contribute to her leg swelling.  UA without any protein and kidney function is normal.  Cardiac work-up unremarkable, chest x-ray unremarkable.  Low suspicion for PE or DVT, low suspicion for ACS, low suspicion for cellulitis.  Encourage patient to follow-up with OB/GYN for further evaluation for further managements of her pregnancy and encouraged to eat food high in protein to aid with her low albumin state.  BP 113/67 (BP Location: Right Arm)   Pulse 87   Temp 97.9 F (36.6 C) (Oral)   Resp 19   Ht 5\' 5"  (1.651 m)   Wt 89.8 kg   LMP 06/12/2019   SpO2 100%   BMI 32.94 kg/m   Results for orders placed or performed during the hospital encounter of 05/27/20  Basic metabolic panel  Result Value Ref Range   Sodium 139 135 - 145 mmol/L   Potassium 3.9 3.5 - 5.1 mmol/L   Chloride 109 98 - 111 mmol/L   CO2 22 22 - 32 mmol/L   Glucose, Bld 88 70 - 99 mg/dL   BUN 11 6 - 20 mg/dL   Creatinine, Ser 05/29/20 0.44 - 1.00 mg/dL   Calcium 8.9 8.9 - 7.06 mg/dL   GFR calc non Af Amer >60 >60 mL/min   GFR calc Af Amer >60 >60 mL/min   Anion gap 8 5 - 15  CBC  Result Value Ref Range   WBC 5.2 4.0 - 10.5 K/uL   RBC 4.60 3.87 - 5.11 MIL/uL   Hemoglobin 11.5 (L) 12.0 - 15.0 g/dL   HCT 23.7 (L) 36 - 46 %   MCV 77.0 (L) 80.0 - 100.0 fL   MCH 25.0 (L) 26.0 - 34.0 pg   MCHC 32.5 30.0 - 36.0 g/dL   RDW 62.8 31.5 - 17.6 %   Platelets 199 150 - 400 K/uL   nRBC 0.0 0.0 - 0.2 %  Hepatic function panel  Result Value Ref Range   Total Protein 5.1 (L) 6.5 - 8.1 g/dL   Albumin 2.6 (L) 3.5 - 5.0 g/dL   AST 32 15 - 41 U/L   ALT 22 0 - 44 U/L   Alkaline Phosphatase 32 (L) 38 - 126 U/L   Total  Bilirubin 0.6 0.3 - 1.2 mg/dL   Bilirubin, Direct 16.0 0.0 - 0.2 mg/dL   Indirect Bilirubin NOT CALCULATED 0.3 - 0.9 mg/dL  hCG, quantitative, pregnancy  Result Value Ref Range   hCG, Beta Chain, Quant, S 8,638 (H) <5 mIU/mL  Urinalysis, Routine w reflex microscopic  Result Value Ref Range   Color, Urine YELLOW YELLOW   APPearance CLEAR CLEAR   Specific Gravity, Urine 1.013 1.005 - 1.030   pH 6.0 5.0 - 8.0   Glucose, UA NEGATIVE NEGATIVE mg/dL   Hgb urine dipstick NEGATIVE NEGATIVE   Bilirubin Urine NEGATIVE NEGATIVE   Ketones, ur 20 (A) NEGATIVE mg/dL   Protein, ur NEGATIVE NEGATIVE mg/dL   Nitrite NEGATIVE NEGATIVE   Leukocytes,Ua SMALL (A) NEGATIVE   RBC / HPF 0-5 0 - 5 RBC/hpf   WBC, UA 0-5 0 - 5 WBC/hpf   Bacteria, UA NONE SEEN NONE SEEN   Squamous Epithelial /  LPF 6-10 0 - 5   Mucus PRESENT   I-Stat beta hCG blood, ED  Result Value Ref Range   I-stat hCG, quantitative >2,000.0 (H) <5 mIU/mL   Comment 3          Troponin I (High Sensitivity)  Result Value Ref Range   Troponin I (High Sensitivity) 2 <18 ng/L  Troponin I (High Sensitivity)  Result Value Ref Range   Troponin I (High Sensitivity) 3 <18 ng/L   DG Chest Portable 1 View  Result Date: 05/28/2020 CLINICAL DATA:  Peripheral edema. EXAM: PORTABLE CHEST 1 VIEW COMPARISON:  None. FINDINGS: The heart size and mediastinal contours are within normal limits. Both lungs are clear. The visualized skeletal structures are unremarkable. IMPRESSION: No active disease. Electronically Signed   By: Monte Fantasia M.D.   On: 05/28/2020 05:42      Domenic Moras, PA-C 05/28/20 0731    Mesner, Corene Cornea, MD 05/29/20 [id number]

## 2020-05-28 NOTE — ED Provider Notes (Signed)
[facility] EMERGENCY DEPARTMENT Provider Note   CSN: [id number] Arrival date & time: 05/27/20  2340     History Chief Complaint  Patient presents with  . Leg Swelling    Lindsey Morgan is a 29 y.o. female.  Patient to ED for evaluation of bilateral LE swelling x 3 days. 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 Symptoms are a little worse now. No SOB, significant pain, fever. No history of same. Elevation does not make the symptoms improve.   The history is provided by the patient. No language interpreter was used.       Past Medical History:  Diagnosis Date  . Migraines     Patient Active Problem List   Diagnosis Date Noted  . Normal labor 11/21/2018  . No prenatal care in current pregnancy 11/21/2018  . Indication for care in labor or delivery 10/08/2018  . Normal labor and delivery 03/03/2017  . NSVD (normal spontaneous vaginal delivery) 03/03/2017  . Labor and delivery, indication for care 04/28/2015  . Supervision of normal pregnancy in third trimester 04/27/2015    Past Surgical History:  Procedure Laterality Date  . WISDOM TOOTH EXTRACTION       OB History    Gravida  4   Para  3   Term  1   Preterm  0   AB  0   Living  3     SAB  0   TAB  0   Ectopic  0   Multiple      Live Births  3           Family History  Problem Relation Age of Onset  . Diabetes Mother     Social History   Tobacco Use  . Smoking status: Never Smoker  . Smokeless tobacco: Never Used  Vaping Use  . Vaping Use: Never used  Substance Use Topics  . Alcohol use: Yes    Comment: occ  . Drug use: No    Home Medications Prior to Admission medications   Medication Sig Start Date End Date Taking? Authorizing Provider  Multiple Vitamins-Calcium (ONE-A-DAY WOMENS FORMULA PO) Take by mouth.    [provider]  phentermine 37.5 MG capsule Take 37.5 mg by mouth every  morning.    [provider]  topiramate (TOPAMAX) 25 MG capsule Take 25 mg by mouth 2 (two) times daily.    [provider]    Allergies    Mushroom extract complex  Review of Systems   Review of Systems  Constitutional: Negative for chills and fever.  HENT: Negative.   Respiratory: Negative.  Negative for shortness of breath.   Cardiovascular: Positive for leg swelling. Negative for chest pain.  Gastrointestinal: Negative.   Musculoskeletal: Negative.   Skin: Negative.  Negative for color change.  Neurological: Negative.  Negative for weakness and numbness.    Physical Exam Updated Vital Signs BP 111/75 (BP Location: Left Arm)   Pulse 83   Temp 97.9 F (36.6 C) (Oral)   Resp (!) 27   Ht 5\' 5"  (1.651 m)   Wt 89.8 kg   LMP 06/12/2019   SpO2 100%   BMI 32.94 kg/m   Physical Exam Vitals and nursing note reviewed.  Constitutional:      Appearance: She is well-developed.  HENT:     Head: Normocephalic.  Cardiovascular:     Rate and Rhythm: Normal rate and regular  rhythm.     Heart sounds: No murmur heard.   Pulmonary:     Effort: Pulmonary effort is normal.     Breath sounds: Normal breath sounds. No wheezing, rhonchi or rales.  Abdominal:     General: Bowel sounds are normal.     Palpations: Abdomen is soft.     Tenderness: There is no abdominal tenderness. There is no guarding or rebound.  Musculoskeletal:        General: Normal range of motion.     Cervical back: Normal range of motion and neck supple.     Right lower leg: Edema present.     Left lower leg: Edema present.     Comments: Lower extremity pitting edema, 1+ bilaterally.  Skin:    General: Skin is warm and dry.     Findings: No rash.  Neurological:     Mental Status: She is alert and oriented to person, place, and time.     ED Results / Procedures / Treatments   Labs (all labs ordered are listed, but only abnormal results are displayed) Labs Reviewed  CBC - Abnormal;  Notable for the following components:      Result Value   Hemoglobin 11.5 (*)    HCT 35.4 (*)    MCV 77.0 (*)    MCH 25.0 (*)    All other components within normal limits  I-STAT BETA HCG BLOOD, ED ([facility], [facility], [facility] ONLY) - Abnormal; Notable for the following components:   I-stat hCG, quantitative >2,000.0 (*)    All other components within normal limits  BASIC METABOLIC PANEL  HEPATIC FUNCTION PANEL  HCG, QUANTITATIVE, PREGNANCY  URINALYSIS, ROUTINE W REFLEX MICROSCOPIC  TROPONIN I (HIGH SENSITIVITY)  TROPONIN I (HIGH SENSITIVITY)    EKG EKG Interpretation  Date/Time:  Monday May 28 2020 00:08:01 EDT Ventricular Rate:  104 PR Interval:  220 QRS Duration: 80 QT Interval:  338 QTC Calculation: 444 R Axis:   78 Text Interpretation: Sinus tachycardia with 1st degree A-V block Nonspecific T wave abnormality Abnormal ECG No old tracing to compare Confirmed by Marily Memos [phone]) on 05/28/2020 5:12:49 AM   Radiology DG Chest Portable 1 View  Result Date: 05/28/2020 CLINICAL DATA:  Peripheral edema. EXAM: PORTABLE CHEST 1 VIEW COMPARISON:  None. FINDINGS: The heart size and mediastinal contours are within normal limits. Both lungs are clear. The visualized skeletal structures are unremarkable. IMPRESSION: No active disease. Electronically Signed   By: Marnee Spring M.D.   On: 05/28/2020 05:42    Procedures Procedures (including critical care time)  Medications Ordered in ED Medications  sodium chloride flush (NS) 0.9 % injection 3 mL (has no administration in time range)    ED Course  I have reviewed the triage vital signs and the nursing notes.  Pertinent labs & imaging results that were available during my care of the patient were reviewed by me and considered in my medical decision making (see chart for details).    MDM Rules/Calculators/A&P                          Patient to ED for evaluation of bilateral LE swelling x 3 days.   Labs confirm pregnancy. Patient  unaware of pregnancy possiblity. No history of pre-eclampsia or other complications with previous pregnancies. Will get additional labs to insure symptoms are not associated with pre-eclampsia.   Patient care signed out to Fayrene Helper, PA-C, to follow up on labs and provide  anticipated discharge.   Final Clinical Impression(s) / ED Diagnoses Final diagnoses:  None   Peripheral edema Pregnant   Rx / DC Orders ED Discharge Orders    None       Danne Harbor 05/28/20 3154    Mesner, Barbara Cower, MD 05/29/20 [phone]

## 2020-05-28 NOTE — ED Triage Notes (Signed)
Pt reports bilateral leg swelling X2 days.  Pt does have cardiac hx.  Denies chest pain.

## 2020-05-28 NOTE — Discharge Instructions (Addendum)
You have been evaluated for your leg swelling. Your albumin level is low which may contribute to leg swelling. Follow up with your OB for routine prenatal care and discuss this with your doctor. Take the prenatal vitamins as prescribed.

## 2020-07-27 ENCOUNTER — Encounter (HOSPITAL_BASED_OUTPATIENT_CLINIC_OR_DEPARTMENT_OTHER): Payer: Self-pay | Admitting: Emergency Medicine

## 2020-07-27 ENCOUNTER — Emergency Department (HOSPITAL_BASED_OUTPATIENT_CLINIC_OR_DEPARTMENT_OTHER)
Admission: EM | Admit: 2020-07-27 | Discharge: 2020-07-27 | Disposition: A | Discharge status: Home / Self Care | Payer: Medicaid Other | Attending: Emergency Medicine | Admitting: Emergency Medicine

## 2020-07-27 ENCOUNTER — Other Ambulatory Visit: Payer: Self-pay

## 2020-07-27 ENCOUNTER — Ambulatory Visit (HOSPITAL_BASED_OUTPATIENT_CLINIC_OR_DEPARTMENT_OTHER): Payer: Medicaid Other | Attending: Emergency Medicine

## 2020-07-27 DIAGNOSIS — Z3A00 Weeks of gestation of pregnancy not specified: Secondary | ICD-10-CM | POA: Diagnosis not present

## 2020-07-27 DIAGNOSIS — O039 Complete or unspecified spontaneous abortion without complication: Secondary | ICD-10-CM | POA: Diagnosis not present

## 2020-07-27 DIAGNOSIS — Z79899 Other long term (current) drug therapy: Secondary | ICD-10-CM | POA: Insufficient documentation

## 2020-07-27 HISTORY — DX: Unspecified atrioventricular block: I44.30

## 2020-07-27 LAB — HCG, QUANTITATIVE, PREGNANCY: hCG, Beta Chain, Quant, S: 1598 m[IU]/mL — ABNORMAL HIGH (ref <5)

## 2020-07-27 NOTE — ED Provider Notes (Signed)
[facility] Provider Note: Lowella Dell, MD, FACEP  CSN: [id number] MRN: [id number] ARRIVAL: 07/27/20 at 0344 ROOM: MH09/MH09   CHIEF COMPLAINT  Vaginal Bleeding   HISTORY OF PRESENT ILLNESS  07/27/20 4:05 AM Lindsey Morgan is a 29 y.o. female who had a positive pregnancy test 05/28/2020 with a quantitative beta-hCG of 8,638 after being seen for lower extremity edema and varicose veins..  She has not had OB follow-up.  She is here with vaginal bleeding since yesterday afternoon.  It is associated with cramping in her lower abdomen which she rates as a 6 out of 10.  She was bleeding heavily, changing pads every 2 hours but this is slowing.  She was lightheaded earlier but not presently.   Past Medical History:  Diagnosis Date  . AV block   . Migraines     Past Surgical History:  Procedure Laterality Date  . WISDOM TOOTH EXTRACTION      Family History  Problem Relation Age of Onset  . Diabetes Mother     Social History   Tobacco Use  . Smoking status: Never Smoker  . Smokeless tobacco: Never Used  Vaping Use  . Vaping Use: Never used  Substance Use Topics  . Alcohol use: Yes    Comment: occ  . Drug use: No    Prior to Admission medications   Medication Sig Start Date End Date Taking? Authorizing Provider  Multiple Vitamins-Calcium (ONE-A-DAY WOMENS FORMULA PO) Take by mouth.    [provider]  phentermine 37.5 MG capsule Take 37.5 mg by mouth every morning.    [provider]  Prenatal Vit-Fe Fumarate-FA (MULTIVITAMIN-PRENATAL) 27-0.8 MG TABS tablet Take 1 tablet by mouth daily at 12 noon. 05/28/20   Elpidio Anis, PA-C  topiramate (TOPAMAX) 25 MG capsule Take 25 mg by mouth 2 (two) times daily.    [provider]    Allergies Mushroom extract complex   REVIEW OF SYSTEMS  Negative except as noted here or in the History of Present Illness.   PHYSICAL EXAMINATION  Initial Vital Signs Blood pressure 135/83,  pulse 77, temperature 98.2 F (36.8 C), temperature source Oral, resp. rate 18, height 5\' 5"  (1.651 m), weight 93.9 kg, SpO2 100 %, unknown if currently breastfeeding.  Examination General: Well-developed, well-nourished female in no acute distress; appearance consistent with age of record HENT: normocephalic; atraumatic Eyes: pupils equal, round and reactive to light; extraocular muscles intact Neck: supple Heart: regular rate and rhythm Lungs: clear to auscultation bilaterally Abdomen: soft; nondistended; mild suprapubic tenderness; bowel sounds present GU: Normal external genitalia; blood and clots in vaginal vault; uterus tender Extremities: No deformity; full range of motion; pulses normal; varicose veins of legs Neurologic: Awake, alert and oriented; motor function intact in all extremities and symmetric; no facial droop Skin: Warm and dry Psychiatric: Normal mood and affect   RESULTS  Summary of this visit's results, reviewed and interpreted by myself:   EKG Interpretation  Date/Time:    Ventricular Rate:    PR Interval:    QRS Duration:   QT Interval:    QTC Calculation:   R Axis:     Text Interpretation:        Laboratory Studies: Results for orders placed or performed during the hospital encounter of 07/27/20 (from the past 24 hour(s))  hCG, quantitative, pregnancy     Status: Abnormal   Collection Time: 07/27/20  4:34 AM  Result Value Ref Range   hCG, Beta Chain, Quant, S 1,598 (  H) <5 mIU/mL   Imaging Studies: No results found.  ED COURSE and MDM  Nursing notes, initial and subsequent vitals signs, including pulse oximetry, reviewed and interpreted by myself.  Vitals:   07/27/20 0401 07/27/20 0402  BP:  135/83  Pulse:  77  Resp:  18  Temp:  98.2 F (36.8 C)  TempSrc:  Oral  SpO2:  100%  Weight: 93.9 kg   Height: 5\' 5"  (1.651 m)    Medications - No data to display  5:55 AM The patient's quantitative beta-hCG is significantly lower than it was  several weeks ago.  I suspect she has had a complete or near complete miscarriage.  We will have her return later this morning for a formal transvaginal ultrasound.  She is Rh+ so not a Rhophylac candidate.  PROCEDURES  Procedures   ED DIAGNOSES     ICD-10-CM   1. Miscarriage  O03.9        Korrine Sicard, MD 07/27/20 0600

## 2020-07-27 NOTE — ED Triage Notes (Signed)
Reports vaginal bleeding onset this afternoon. Also reports cramping. Changing pads every 2 hours. Positive preg on 6/27 at [facility], has not seen her OB.

## 2020-07-30 LAB — GC/CHLAMYDIA PROBE AMP (~~LOC~~) NOT AT ARMC
Chlamydia: NEGATIVE
Comment: NEGATIVE
Comment: NORMAL
Neisseria Gonorrhea: NEGATIVE

## 2020-08-02 ENCOUNTER — Other Ambulatory Visit: Payer: Medicaid Other

## 2020-08-02 ENCOUNTER — Other Ambulatory Visit: Payer: Self-pay

## 2020-08-02 DIAGNOSIS — Z20822 Contact with and (suspected) exposure to covid-19: Secondary | ICD-10-CM

## 2020-08-04 LAB — NOVEL CORONAVIRUS, NAA: SARS-CoV-2, NAA: DETECTED — AB

## 2020-08-06 ENCOUNTER — Other Ambulatory Visit (HOSPITAL_COMMUNITY): Payer: Self-pay | Admitting: Adult Health

## 2020-08-06 ENCOUNTER — Encounter: Payer: Self-pay | Admitting: Adult Health

## 2020-08-06 DIAGNOSIS — U071 COVID-19: Secondary | ICD-10-CM

## 2020-08-06 NOTE — Progress Notes (Signed)
I connected by phone with Lindsey Morgan on 08/06/2020 at 4:36 PM to discuss the potential use of a new treatment for mild to moderate COVID-19 viral infection in non-hospitalized patients.  This patient is a 29 y.o. female that meets the FDA criteria for Emergency Use Authorization of COVID monoclonal antibody casirivimab/imdevimab.  Has a (+) direct SARS-CoV-2 viral test result  Has mild or moderate COVID-19   Is NOT hospitalized due to COVID-19  Is within 10 days of symptom onset  Has at least one of the high risk factor(s) for progression to severe COVID-19 and/or hospitalization as defined in EUA.  Specific high risk criteria : BMI > 25   I have spoken and communicated the following to the patient or parent/caregiver regarding COVID monoclonal antibody treatment:  1. FDA has authorized the emergency use for the treatment of mild to moderate COVID-19 in adults and pediatric patients with positive results of direct SARS-CoV-2 viral testing who are 14 years of age and older weighing at least 40 kg, and who are at high risk for progressing to severe COVID-19 and/or hospitalization.  2. The significant known and potential risks and benefits of COVID monoclonal antibody, and the extent to which such potential risks and benefits are unknown.  3. Information on available alternative treatments and the risks and benefits of those alternatives, including clinical trials.  4. Patients treated with COVID monoclonal antibody should continue to self-isolate and use infection control measures (e.g., wear mask, isolate, social distance, avoid sharing personal items, clean and disinfect "high touch" surfaces, and frequent handwashing) according to CDC guidelines.   5. The patient or parent/caregiver has the option to accept or refuse COVID monoclonal antibody treatment.  After reviewing this information with the patient, The patient agreed to proceed with receiving casirivimab\imdevimab infusion  and will be provided a copy of the Fact sheet prior to receiving the infusion. [name] 08/06/2020 4:36 PM

## 2020-08-07 ENCOUNTER — Ambulatory Visit (HOSPITAL_COMMUNITY)
Admission: RE | Admit: 2020-08-07 | Discharge: 2020-08-07 | Disposition: A | Discharge status: Home / Self Care | Payer: Medicaid Other | Source: Ambulatory Visit | Attending: Pulmonary Disease | Admitting: Pulmonary Disease

## 2020-08-07 DIAGNOSIS — U071 COVID-19: Secondary | ICD-10-CM | POA: Diagnosis not present

## 2020-08-07 MED ORDER — ALBUTEROL SULFATE HFA 108 (90 BASE) MCG/ACT IN AERS: 2.0000 | INHALATION_SPRAY | Freq: Once | RESPIRATORY_TRACT | Status: DC | PRN

## 2020-08-07 MED ORDER — DIPHENHYDRAMINE HCL 50 MG/ML IJ SOLN: 50.0000 mg | Freq: Once | INTRAMUSCULAR | Status: DC | PRN

## 2020-08-07 MED ORDER — SODIUM CHLORIDE 0.9 % IV SOLN: INTRAVENOUS | Status: DC | PRN

## 2020-08-07 MED ORDER — METHYLPREDNISOLONE SODIUM SUCC 125 MG IJ SOLR: 125.0000 mg | Freq: Once | INTRAMUSCULAR | Status: DC | PRN

## 2020-08-07 MED ORDER — FAMOTIDINE IN NACL 20-0.9 MG/50ML-% IV SOLN: 20.0000 mg | Freq: Once | INTRAVENOUS | Status: DC | PRN

## 2020-08-07 MED ORDER — SODIUM CHLORIDE 0.9 % IV SOLN
1200.0000 mg | Freq: Once | INTRAVENOUS | Status: AC
  Administered 2020-08-07: 1200 mg via INTRAVENOUS
  Filled 2020-08-07: qty 10

## 2020-08-07 MED ORDER — EPINEPHRINE 0.3 MG/0.3ML IJ SOAJ: 0.3000 mg | Freq: Once | INTRAMUSCULAR | Status: DC | PRN

## 2020-08-07 NOTE — Discharge Instructions (Signed)
What types of side effects do monoclonal antibody drugs cause?  Common side effects In general, the more common side effects caused by monoclonal antibody drugs include: . Allergic reactions, such as hives or itching . Flu-like signs and symptoms, including chills, fatigue, fever, and muscle aches and pains . Nausea, vomiting . Diarrhea . Skin rashes . Low blood pressure   The CDC is recommending patients who receive monoclonal antibody treatments wait at least 90 days before being vaccinated:  Currently, there are no data on the safety and efficacy of mRNA COVID-19 vaccines in persons who received monoclonal antibodies or convalescent plasma as part of COVID-19 treatment. Based on the estimated half-life of such therapies as well as evidence suggesting that reinfection is uncommon in the 90 days after initial infection, vaccination should be deferred for at least 90 days, as a precautionary measure until additional information becomes available, to avoid interference of the antibody treatment with vaccine-induced immune responses    10 Things You Can Do to Manage Your COVID-19 Symptoms at Home If you have possible or confirmed COVID-19: 1. Stay home from work and school. And stay away from other public places. If you must go out, avoid using any kind of public transportation, ridesharing, or taxis. 2. Monitor your symptoms carefully. If your symptoms get worse, call your healthcare provider immediately. 3. Get rest and stay hydrated. 4. If you have a medical appointment, call the healthcare provider ahead of time and tell them that you have or may have COVID-19. 5. For medical emergencies, call 911 and notify the dispatch personnel that you have or may have COVID-19. 6. Cover your cough and sneezes with a tissue or use the inside of your elbow. 7. Wash your hands often with soap and water for at least 20 seconds or clean your hands with an alcohol-based hand sanitizer that contains at  least 60% alcohol. 8. As much as possible, stay in a specific room and away from other people in your home. Also, you should use a separate bathroom, if available. If you need to be around other people in or outside of the home, wear a mask. 9. Avoid sharing personal items with other people in your household, like dishes, towels, and bedding. 10. Clean all surfaces that are touched often, like counters, tabletops, and doorknobs. Use household cleaning sprays or wipes according to the label instructions. cdc.gov/coronavirus 06/01/2019 This information is not intended to replace advice given to you by your health care provider. Make sure you discuss any questions you have with your health care provider. Document Revised: 11/03/2019 Document Reviewed: 11/03/2019 Elsevier Patient Education  2020 [employer] 10 Things You Can Do to Manage Your COVID-19 Symptoms at Home If you have possible or confirmed COVID-19: 11. Stay home from work and school. And stay away from other public places. If you must go out, avoid using any kind of public transportation, ridesharing, or taxis. 12. Monitor your symptoms carefully. If your symptoms get worse, call your healthcare provider immediately. 13. Get rest and stay hydrated. 14. If you have a medical appointment, call the healthcare provider ahead of time and tell them that you have or may have COVID-19. 15. For medical emergencies, call 911 and notify the dispatch personnel that you have or may have COVID-19. 16. Cover your cough and sneezes with a tissue or use the inside of your elbow. 17. Wash your hands often with soap and water for at least 20 seconds or clean your hands with an alcohol-based hand   sanitizer that contains at least 60% alcohol. 18. As much as possible, stay in a specific room and away from other people in your home. Also, you should use a separate bathroom, if available. If you need to be around other people in or outside of the home, wear a  mask. 19. Avoid sharing personal items with other people in your household, like dishes, towels, and bedding. 20. Clean all surfaces that are touched often, like counters, tabletops, and doorknobs. Use household cleaning sprays or wipes according to the label instructions. cdc.gov/coronavirus 06/01/2019 This information is not intended to replace advice given to you by your health care provider. Make sure you discuss any questions you have with your health care provider. Document Revised: 11/03/2019 Document Reviewed: 11/03/2019 Elsevier Patient Education  2020 Elsevier Inc.  

## 2020-08-07 NOTE — Progress Notes (Signed)
°  Diagnosis: COVID-19 ° °Physician: Dr. Wright ° °Procedure: [facility] Med: casirivimab\imdevimab infusion - Provided patient with casirivimab\imdevimab fact sheet for patients, parents and caregivers prior to infusion. ° °Complications: No immediate complications noted. ° °Discharge: Discharged home  ° °[name] °08/07/2020 ° ° °

## 2021-02-16 ENCOUNTER — Other Ambulatory Visit: Payer: Self-pay

## 2021-02-16 ENCOUNTER — Encounter: Payer: Self-pay | Admitting: Emergency Medicine

## 2021-02-16 ENCOUNTER — Emergency Department: Payer: Medicaid Other

## 2021-02-16 DIAGNOSIS — R0602 Shortness of breath: Secondary | ICD-10-CM | POA: Insufficient documentation

## 2021-02-16 DIAGNOSIS — R079 Chest pain, unspecified: Secondary | ICD-10-CM

## 2021-02-16 DIAGNOSIS — R0789 Other chest pain: Secondary | ICD-10-CM | POA: Diagnosis not present

## 2021-02-16 LAB — BASIC METABOLIC PANEL
Anion gap: 8 (ref 5–15)
BUN: 16 mg/dL (ref 6–20)
CO2: 25 mmol/L (ref 22–32)
Calcium: 9.2 mg/dL (ref 8.9–10.3)
Chloride: 107 mmol/L (ref 98–111)
Creatinine, Ser: 0.9 mg/dL (ref 0.44–1.00)
GFR, Estimated: >60 mL/min (ref >60)
Glucose, Bld: 109 mg/dL — ABNORMAL HIGH (ref 70–99)
Potassium: 3.6 mmol/L (ref 3.5–5.1)
Sodium: 140 mmol/L (ref 135–145)

## 2021-02-16 LAB — CBC
HCT: 41 % (ref 36.0–46.0)
Hemoglobin: 13.1 g/dL (ref 12.0–15.0)
MCH: 24.1 pg — ABNORMAL LOW (ref 26.0–34.0)
MCHC: 32 g/dL (ref 30.0–36.0)
MCV: 75.5 fL — ABNORMAL LOW (ref 80.0–100.0)
Platelets: 269 10*3/uL (ref 150–400)
RBC: 5.43 MIL/uL — ABNORMAL HIGH (ref 3.87–5.11)
RDW: 13.2 % (ref 11.5–15.5)
WBC: 6.2 10*3/uL (ref 4.0–10.5)
nRBC: 0 % (ref 0.0–0.2)

## 2021-02-16 LAB — TROPONIN I (HIGH SENSITIVITY): Troponin I (High Sensitivity): 2 ng/L (ref <18)

## 2021-02-16 NOTE — ED Triage Notes (Signed)
Pt presents to ER from home with complaints of chest pressure for 2 days. Pt reports history of AV block, reports shortness of breath and some nausea. Pt reports more difficult to breath when trying to lye down on her back Pt talks in complete sentences no distress noted.

## 2021-02-17 ENCOUNTER — Emergency Department
Admission: EM | Admit: 2021-02-17 | Discharge: 2021-02-17 | Disposition: A | Discharge status: Home / Self Care | Payer: Medicaid Other | Attending: Emergency Medicine | Admitting: Emergency Medicine

## 2021-02-17 DIAGNOSIS — R079 Chest pain, unspecified: Secondary | ICD-10-CM

## 2021-02-17 DIAGNOSIS — R0789 Other chest pain: Secondary | ICD-10-CM

## 2021-02-17 NOTE — Discharge Instructions (Signed)
Your workup in the Emergency Department today was reassuring.  We did not find any specific abnormalities.  We recommend you drink plenty of fluids, take your regular medications and/or any new ones prescribed today, and follow up with the doctor(s) listed in these documents as recommended.  Return to the Emergency Department if you develop new or worsening symptoms that concern you.  

## 2021-02-17 NOTE — ED Provider Notes (Signed)
[facility] Emergency Department Provider Note  ____________________________________________   Event Date/Time   First MD Initiated Contact with Patient 02/17/21 [phone]     (approximate)  I have reviewed the triage vital signs and the nursing notes.   HISTORY  Chief Complaint Chest Pain    HPI Lindsey Morgan is a 30 y.o. female with medical history as listed below who presents for evaluation of several days of intermittent chest pain.  She describes as a heaviness and occasionally sharp pain.  Nothing particular seems to make it better or worse and at times it is severe, currently she is asymptomatic.  She says she thinks maybe it is her anxiety.  She had similar symptoms back in August of last year when she had COVID-19.  However she has been vaccinated since then and has no other symptoms including fever, loss of smell or taste, sore throat, nasal congestion, etc.  She thinks that sometimes she has some shortness of breath particular if she lies down flat but she thinks that may also be her anxiety.  She denies nausea, vomiting, and abdominal pain.  No history of blood clots in the legs of the lungs.  No recent surgeries nor immobilizations.         Past Medical History:  Diagnosis Date  . AV block   . Migraines     Patient Active Problem List   Diagnosis Date Noted  . Normal labor 11/21/2018  . No prenatal care in current pregnancy 11/21/2018  . Indication for care in labor or delivery 10/08/2018  . Normal labor and delivery 03/03/2017  . NSVD (normal spontaneous vaginal delivery) 03/03/2017  . Labor and delivery, indication for care 04/28/2015  . Supervision of normal pregnancy in third trimester 04/27/2015    Past Surgical History:  Procedure Laterality Date  . WISDOM TOOTH EXTRACTION      Prior to Admission medications   Medication Sig Start Date End Date Taking? Authorizing Provider  Multiple Vitamins-Calcium (ONE-A-DAY WOMENS  FORMULA PO) Take by mouth.    [provider]  phentermine 37.5 MG capsule Take 37.5 mg by mouth every morning.    [provider]  Prenatal Vit-Fe Fumarate-FA (MULTIVITAMIN-PRENATAL) 27-0.8 MG TABS tablet Take 1 tablet by mouth daily at 12 noon. 05/28/20   Elpidio Anis, PA-C  topiramate (TOPAMAX) 25 MG capsule Take 25 mg by mouth 2 (two) times daily.    [provider]    Allergies Mushroom extract complex  Family History  Problem Relation Age of Onset  . Diabetes Mother     Social History Social History   Tobacco Use  . Smoking status: Never Smoker  . Smokeless tobacco: Never Used  Vaping Use  . Vaping Use: Never used  Substance Use Topics  . Alcohol use: Yes    Comment: occ  . Drug use: No    Review of Systems Constitutional: No fever/chills Eyes: No visual changes. ENT: No sore throat. Cardiovascular: +chest pain. Respiratory: Occasional shortness of breath with some orthopnea. Gastrointestinal: No abdominal pain.  No nausea, no vomiting.  No diarrhea.  No constipation. Genitourinary: Negative for dysuria. Musculoskeletal: Negative for neck pain.  Negative for back pain. Integumentary: Negative for rash. Neurological: Negative for headaches, focal weakness or numbness.   ____________________________________________   PHYSICAL EXAM:  VITAL SIGNS: ED Triage Vitals  Enc Vitals Group     BP 02/17/21 0042 122/76     Pulse Rate 02/17/21 0042 70     Resp 02/17/21 0042  20     Temp 02/17/21 0042 98 F (36.7 C)     Temp Source 02/17/21 0042 Oral     SpO2 02/17/21 0042 100 %     Weight 02/16/21 2223 88.5 kg (195 lb)     Height 02/16/21 2223 1.651 m (5\' 5" )     Head Circumference --      Peak Flow --      Pain Score 02/16/21 2223 8     Pain Loc --      Pain Edu? --      Excl. in GC? --     Constitutional: Alert and oriented.  Eyes: Conjunctivae are normal.  Head: Atraumatic. Nose: No congestion/rhinnorhea. Mouth/Throat: Patient  is wearing a mask. Neck: No stridor.  No meningeal signs.   Cardiovascular: Normal rate, regular rhythm. Good peripheral circulation.  Normal heart sounds with no murmur nor rub. Respiratory: Normal respiratory effort.  No retractions.  No wheezes, rales, nor rhonchi. Gastrointestinal: Soft and nontender. No distention.  Musculoskeletal: No lower extremity tenderness nor edema. No gross deformities of extremities. Neurologic:  Normal speech and language. No gross focal neurologic deficits are appreciated.  Skin:  Skin is warm, dry and intact. Psychiatric: Mood and affect are normal. Speech and behavior are normal.  ____________________________________________   LABS (all labs ordered are listed, but only abnormal results are displayed)  Labs Reviewed  BASIC METABOLIC PANEL - Abnormal; Notable for the following components:      Result Value   Glucose, Bld 109 (*)    All other components within normal limits  CBC - Abnormal; Notable for the following components:   RBC 5.43 (*)    MCV 75.5 (*)    MCH 24.1 (*)    All other components within normal limits  CBG MONITORING, ED  POC URINE PREG, ED  TROPONIN I (HIGH SENSITIVITY)   ____________________________________________  EKG  ED ECG REPORT I, 2224, the attending physician, personally viewed and interpreted this ECG.  Date: 02/16/2021 EKG Time: 22: 22 Rate: 97 Rhythm: normal sinus rhythm QRS Axis: normal Intervals: normal ST/T Wave abnormalities: Non-specific ST segment / T-wave changes, but no clear evidence of acute ischemia. Narrative Interpretation: no definitive evidence of acute ischemia; does not meet STEMI criteria.   ____________________________________________  RADIOLOGY I, 02/18/2021, personally viewed and evaluated these images (plain radiographs) as part of my medical decision making, as well as reviewing the written report by the radiologist.  ED MD interpretation: No acute abnormalities on chest  x-ray  Official radiology report(s): DG Chest 2 View  Result Date: 02/16/2021 CLINICAL DATA:  Chest pain, pressure for 2 days, shortness of breath and nausea, history of AV block EXAM: CHEST - 2 VIEW COMPARISON:  12/29/2019 FINDINGS: No consolidation, features of edema, pneumothorax, or effusion. Pulmonary vascularity is normally distributed. The cardiomediastinal contours are unremarkable. No acute osseous or soft tissue abnormality. IMPRESSION: No acute cardiopulmonary abnormality. Electronically Signed   By: 12/31/2019 M.D.   On: 02/16/2021 23:03    ____________________________________________   PROCEDURES   Procedure(s) performed (including Critical Care):  Procedures   ____________________________________________   INITIAL IMPRESSION / MDM / ASSESSMENT AND PLAN / ED COURSE  As part of my medical decision making, I reviewed the following data within the electronic MEDICAL RECORD NUMBER Nursing notes reviewed and incorporated, Labs reviewed , EKG interpreted , Old EKG reviewed, Old chart reviewed, Radiograph reviewed  and Notes from prior ED visits   Differential diagnosis includes, but is not limited  to, anxiety, viral illness, pneumonia, ACS, PE.  I believe the patient most likely is suffering from a degree of anxiety.  Her vital signs are stable and within normal limits.  She is PERC negative and low risk for ACS based on HEAR score.  EKG is nonspecific but without any sign of ischemia.  She has a history of first-degree AV block rather than a second or third-degree block.  She is asymptomatic at this time.  Vital signs are normal.  Lab work is reassuring including basic metabolic panel and CBC and high-sensitivity troponin.  No indication to repeat given duration of symptoms and her very low risk of ACS.  I personally reviewed the patient's imaging and agree with the radiologist's interpretation that there is no evidence of any acute or emergent condition.  Patient is comfortable  with the work-up tonight and feels better.  She will follow-up as an outpatient.  I gave my usual and customary return precautions.           ____________________________________________  FINAL CLINICAL IMPRESSION(S) / ED DIAGNOSES  Final diagnoses:  Atypical chest pain     MEDICATIONS GIVEN DURING THIS VISIT:  Medications - No data to display   ED Discharge Orders    None      *Please note:  Odesser Tourangeau was evaluated in Emergency Department on 02/17/2021 for the symptoms described in the history of present illness. She was evaluated in the context of the global COVID-19 pandemic, which necessitated consideration that the patient might be at risk for infection with the SARS-CoV-2 virus that causes COVID-19. Institutional protocols and algorithms that pertain to the evaluation of patients at risk for COVID-19 are in a state of rapid change based on information released by regulatory bodies including the CDC and federal and state organizations. These policies and algorithms were followed during the patient's care in the ED.  Some ED evaluations and interventions may be delayed as a result of limited staffing during and after the pandemic.*  Note:  This document was prepared using Dragon voice recognition software and may include unintentional dictation errors.   Loleta Rose, MD 02/17/21 [phone]

## 2021-04-09 ENCOUNTER — Other Ambulatory Visit: Payer: Self-pay

## 2021-04-09 ENCOUNTER — Emergency Department
Admission: EM | Admit: 2021-04-09 | Discharge: 2021-04-09 | Disposition: A | Discharge status: Home / Self Care | Payer: Medicaid Other | Attending: Emergency Medicine | Admitting: Emergency Medicine

## 2021-04-09 DIAGNOSIS — R0789 Other chest pain: Secondary | ICD-10-CM | POA: Diagnosis not present

## 2021-04-09 DIAGNOSIS — R0602 Shortness of breath: Secondary | ICD-10-CM

## 2021-04-09 DIAGNOSIS — R002 Palpitations: Secondary | ICD-10-CM | POA: Diagnosis not present

## 2021-04-09 LAB — COMPREHENSIVE METABOLIC PANEL
ALT: 16 U/L (ref 0–44)
AST: 22 U/L (ref 15–41)
Albumin: 3.6 g/dL (ref 3.5–5.0)
Alkaline Phosphatase: 40 U/L (ref 38–126)
Anion gap: 8 (ref 5–15)
BUN: 8 mg/dL (ref 6–20)
CO2: 22 mmol/L (ref 22–32)
Calcium: 8.6 mg/dL — ABNORMAL LOW (ref 8.9–10.3)
Chloride: 105 mmol/L (ref 98–111)
Creatinine, Ser: 0.79 mg/dL (ref 0.44–1.00)
GFR, Estimated: >60 mL/min (ref >60)
Glucose, Bld: 116 mg/dL — ABNORMAL HIGH (ref 70–99)
Potassium: 3.6 mmol/L (ref 3.5–5.1)
Sodium: 135 mmol/L (ref 135–145)
Total Bilirubin: 0.6 mg/dL (ref 0.3–1.2)
Total Protein: 6.5 g/dL (ref 6.5–8.1)

## 2021-04-09 LAB — CBC WITH DIFFERENTIAL/PLATELET
Abs Immature Granulocytes: 0.02 10*3/uL (ref 0.00–0.07)
Basophils Absolute: 0 10*3/uL (ref 0.0–0.1)
Basophils Relative: 1 %
Eosinophils Absolute: 0.1 10*3/uL (ref 0.0–0.5)
Eosinophils Relative: 2 %
HCT: 38.3 % (ref 36.0–46.0)
Hemoglobin: 12.7 g/dL (ref 12.0–15.0)
Immature Granulocytes: 1 %
Lymphocytes Relative: 42 %
Lymphs Abs: 1.7 10*3/uL (ref 0.7–4.0)
MCH: 24.3 pg — ABNORMAL LOW (ref 26.0–34.0)
MCHC: 33.2 g/dL (ref 30.0–36.0)
MCV: 73.2 fL — ABNORMAL LOW (ref 80.0–100.0)
Monocytes Absolute: 0.3 10*3/uL (ref 0.1–1.0)
Monocytes Relative: 7 %
Neutro Abs: 2 10*3/uL (ref 1.7–7.7)
Neutrophils Relative %: 47 %
Platelets: 263 10*3/uL (ref 150–400)
RBC: 5.23 MIL/uL — ABNORMAL HIGH (ref 3.87–5.11)
RDW: 14.1 % (ref 11.5–15.5)
WBC: 4.2 10*3/uL (ref 4.0–10.5)
nRBC: 0 % (ref 0.0–0.2)

## 2021-04-09 LAB — LIPASE, BLOOD: Lipase: 27 U/L (ref 11–51)

## 2021-04-09 LAB — HCG, QUANTITATIVE, PREGNANCY: hCG, Beta Chain, Quant, S: 46795 m[IU]/mL — ABNORMAL HIGH (ref <5)

## 2021-04-09 LAB — TROPONIN I (HIGH SENSITIVITY): Troponin I (High Sensitivity): <2 ng/L (ref <18)

## 2021-04-09 MED ORDER — SODIUM CHLORIDE 0.9% FLUSH
3.0000 mL | Freq: Once | INTRAVENOUS | Status: AC
  Administered 2021-04-09: 3 mL via INTRAVENOUS

## 2021-04-09 NOTE — ED Provider Notes (Signed)
[facility] Emergency Department Provider Note   ____________________________________________   Event Date/Time   First MD Initiated Contact with Patient 04/09/21 [phone]     (approximate)  I have reviewed the triage vital signs and the nursing notes.   HISTORY  Chief Complaint Chest Pain    HPI Lindsey Morgan is a 30 y.o. female with a stated past medical history as listed below who presents for centralized chest pressure that began approximately 30 minutes prior to arrival and is described as nonradiating, central chest tightness/pressure that is not worsened with exertion and has no other exacerbating or relieving factors.  Patient states that this pain has somewhat resolved at this time but is still present at a 3/10.  Patient states that she has had similar symptoms in the past and sometimes are associated with palpitations.  Patient denies ever having syncope during these events or worsening shortness of breath with exertion.  Patient currently denies any vision changes, tinnitus, difficulty speaking, facial droop, sore throat, abdominal pain, nausea/vomiting/diarrhea, dysuria, or weakness/numbness/paresthesias in any extremity         Past Medical History:  Diagnosis Date  . AV block   . Migraines     Patient Active Problem List   Diagnosis Date Noted  . Normal labor 11/21/2018  . No prenatal care in current pregnancy 11/21/2018  . Indication for care in labor or delivery 10/08/2018  . Normal labor and delivery 03/03/2017  . NSVD (normal spontaneous vaginal delivery) 03/03/2017  . Labor and delivery, indication for care 04/28/2015  . Supervision of normal pregnancy in third trimester 04/27/2015    Past Surgical History:  Procedure Laterality Date  . WISDOM TOOTH EXTRACTION      Prior to Admission medications   Medication Sig Start Date End Date Taking? Authorizing Provider  Multiple Vitamins-Calcium (ONE-A-DAY WOMENS FORMULA PO)  Take by mouth.    [provider]  phentermine 37.5 MG capsule Take 37.5 mg by mouth every morning.    [provider]  Prenatal Vit-Fe Fumarate-FA (MULTIVITAMIN-PRENATAL) 27-0.8 MG TABS tablet Take 1 tablet by mouth daily at 12 noon. 05/28/20   Elpidio Anis, PA-C  topiramate (TOPAMAX) 25 MG capsule Take 25 mg by mouth 2 (two) times daily.    [provider]    Allergies Mushroom extract complex  Family History  Problem Relation Age of Onset  . Diabetes Mother     Social History Social History   Tobacco Use  . Smoking status: Never Smoker  . Smokeless tobacco: Never Used  Vaping Use  . Vaping Use: Never used  Substance Use Topics  . Alcohol use: Yes    Comment: occ  . Drug use: No    Review of Systems Constitutional: No fever/chills Eyes: No visual changes. ENT: No sore throat. Cardiovascular: Endorses chest pain. Respiratory: Endorses shortness of breath. Gastrointestinal: No abdominal pain.  No nausea, no vomiting.  No diarrhea. Genitourinary: Negative for dysuria. Musculoskeletal: Negative for acute arthralgias Skin: Negative for rash. Neurological: Negative for headaches, weakness/numbness/paresthesias in any extremity Psychiatric: Negative for suicidal ideation/homicidal ideation   ____________________________________________   PHYSICAL EXAM:  VITAL SIGNS: ED Triage Vitals  Enc Vitals Group     BP 04/09/21 0623 111/60     Pulse Rate 04/09/21 0623 81     Resp 04/09/21 0623 16     Temp 04/09/21 0623 97.7 F (36.5 C)     Temp Source 04/09/21 0623 Oral     SpO2 04/09/21 0623 99 %  Weight 04/09/21 0621 197 lb (89.4 kg)     Height 04/09/21 0621 5\' 5"  (1.651 m)     Head Circumference --      Peak Flow --      Pain Score 04/09/21 0621 7     Pain Loc --      Pain Edu? --      Excl. in GC? --    Constitutional: Alert and oriented. Well appearing and in no acute distress. Eyes: Conjunctivae are normal. PERRL. Head:  Atraumatic. Nose: No congestion/rhinnorhea. Mouth/Throat: Mucous membranes are moist. Neck: No stridor Cardiovascular: Grossly normal heart sounds.  Good peripheral circulation. Respiratory: Normal respiratory effort.  No retractions.  Clear to auscultation bilaterally Gastrointestinal: Soft and nontender. No distention. Musculoskeletal: No obvious deformities Neurologic:  Normal speech and language. No gross focal neurologic deficits are appreciated. Skin:  Skin is warm and dry. No rash noted. Psychiatric: Mood and affect are normal. Speech and behavior are normal.  ____________________________________________   LABS (all labs ordered are listed, but only abnormal results are displayed)  Labs Reviewed  CBC WITH DIFFERENTIAL/PLATELET - Abnormal; Notable for the following components:      Result Value   RBC 5.23 (*)    MCV 73.2 (*)    MCH 24.3 (*)    All other components within normal limits  COMPREHENSIVE METABOLIC PANEL - Abnormal; Notable for the following components:   Glucose, Bld 116 (*)    Calcium 8.6 (*)    All other components within normal limits  HCG, QUANTITATIVE, PREGNANCY - Abnormal; Notable for the following components:   hCG, Beta Chain, Quant, S 06/09/21 (*)    All other components within normal limits  LIPASE, BLOOD  TROPONIN I (HIGH SENSITIVITY)  TROPONIN I (HIGH SENSITIVITY)  TROPONIN I (HIGH SENSITIVITY)   ____________________________________________  EKG  ED ECG REPORT I, 73,428, the attending physician, personally viewed and interpreted this ECG.  Date: 04/09/2021 EKG Time: 0624 Rate: 76 Rhythm: normal sinus rhythm QRS Axis: normal Intervals: normal ST/T Wave abnormalities: normal Narrative Interpretation: no evidence of acute ischemia  ____________________________________________ PROCEDURES  Procedure(s) performed (including Critical Care):  .1-3 Lead EKG Interpretation Performed by: [id number], MD Authorized by: Merwyn Katos, MD     Interpretation: normal     ECG rate:  70   ECG rate assessment: normal     Rhythm: sinus rhythm     Ectopy: none     Conduction: normal       ____________________________________________   INITIAL IMPRESSION / ASSESSMENT AND PLAN / ED COURSE  As part of my medical decision making, I reviewed the following data within the electronic MEDICAL RECORD NUMBER Nursing notes reviewed and incorporated, Labs reviewed, EKG interpreted, Old chart reviewed, Radiograph reviewed and Notes from prior ED visits reviewed and incorporated        Workup: ECG, CXR, CBC, BMP, Troponin Findings: ECG: No overt evidence of STEMI. No evidence of Brugadas sign, delta wave, epsilon wave, significantly prolonged QTc, or malignant arrhythmia HS Troponin: Negative x1 Other Labs unremarkable for emergent problems. CXR: Without PTX, PNA, or widened mediastinum Last Stress Test: Never Last Heart Catheterization: Never HEART Score: 2  Given History, Exam, and Workup I have low suspicion for ACS, Pneumothorax, Pneumonia, Pulmonary Embolus, Tamponade, Aortic Dissection or other emergent problem as a cause for this presentation.   Reassesment: Prior to discharge patients pain was controlled and they were well appearing.  Disposition:  Discharge. Strict return precautions discussed with patient  with full understanding. Advised patient to follow up promptly with primary care provider      ____________________________________________   FINAL CLINICAL IMPRESSION(S) / ED DIAGNOSES  Final diagnoses:  Chest tightness  SOB (shortness of breath)     ED Discharge Orders    None       Note:  This document was prepared using Dragon voice recognition software and may include unintentional dictation errors.   Merwyn Katos, MD 04/09/21 [phone]

## 2021-04-09 NOTE — ED Triage Notes (Addendum)
reports onset of centralized chest pressure approx 30 minutes ago, hx of same- 1st degree AV block per patient. Exertional SOB since chest pain started. States she is [redacted] weeks pregnant by last menstrual cycle; no OBGYN care started.  Pt alert and oriented X4, cooperative, color WNL. Pt in NAD.

## 2021-04-16 ENCOUNTER — Telehealth: Payer: Self-pay | Admitting: Cardiology

## 2021-04-16 NOTE — Telephone Encounter (Signed)
Tessa Lerner, DO  [place], [place] Just make a note in the chart.   ST        Previous Messages   ----- Message -----  From: Andreas Blower  Sent: 04/12/2021 11:04 AM EDT  To: Tessa Lerner, DO, *  Subject: RE: ED Report                   Tried to call patient but it said not accepting calls at this time  ----- Message -----  From: Tessa Lerner, DO  Sent: 04/09/2021  2:15 PM EDT  To: Andreas Blower  Subject: RE: ED Report                   Please offer her an OV late May or early June (within 30day).   ST   ----- Message -----  From: Andreas Blower  Sent: 04/09/2021  1:17 PM EDT  To: Tessa Lerner, DO  Subject: ED Report                     FYI  ED Rport in Epic   Patient was seen for chest tightness and sob at [facility]

## 2021-04-22 ENCOUNTER — Other Ambulatory Visit: Payer: Self-pay

## 2021-04-22 ENCOUNTER — Emergency Department
Admission: EM | Admit: 2021-04-22 | Discharge: 2021-04-22 | Disposition: A | Discharge status: Home / Self Care | Payer: Medicaid Other | Attending: Emergency Medicine | Admitting: Emergency Medicine

## 2021-04-22 DIAGNOSIS — Z5321 Procedure and treatment not carried out due to patient leaving prior to being seen by health care provider: Secondary | ICD-10-CM | POA: Insufficient documentation

## 2021-04-22 DIAGNOSIS — O26891 Other specified pregnancy related conditions, first trimester: Secondary | ICD-10-CM | POA: Insufficient documentation

## 2021-04-22 DIAGNOSIS — R0789 Other chest pain: Secondary | ICD-10-CM | POA: Diagnosis not present

## 2021-04-22 DIAGNOSIS — Z3A01 Less than 8 weeks gestation of pregnancy: Secondary | ICD-10-CM | POA: Insufficient documentation

## 2021-04-22 LAB — COMPREHENSIVE METABOLIC PANEL
ALT: 16 U/L (ref 0–44)
AST: 21 U/L (ref 15–41)
Albumin: 3.4 g/dL — ABNORMAL LOW (ref 3.5–5.0)
Alkaline Phosphatase: 39 U/L (ref 38–126)
Anion gap: 7 (ref 5–15)
BUN: 13 mg/dL (ref 6–20)
CO2: 25 mmol/L (ref 22–32)
Calcium: 8.5 mg/dL — ABNORMAL LOW (ref 8.9–10.3)
Chloride: 104 mmol/L (ref 98–111)
Creatinine, Ser: 0.81 mg/dL (ref 0.44–1.00)
GFR, Estimated: >60 mL/min (ref >60)
Glucose, Bld: 96 mg/dL (ref 70–99)
Potassium: 3.9 mmol/L (ref 3.5–5.1)
Sodium: 136 mmol/L (ref 135–145)
Total Bilirubin: 0.7 mg/dL (ref 0.3–1.2)
Total Protein: 6 g/dL — ABNORMAL LOW (ref 6.5–8.1)

## 2021-04-22 LAB — CBC
HCT: 41.4 % (ref 36.0–46.0)
Hemoglobin: 13.4 g/dL (ref 12.0–15.0)
MCH: 24.2 pg — ABNORMAL LOW (ref 26.0–34.0)
MCHC: 32.4 g/dL (ref 30.0–36.0)
MCV: 74.9 fL — ABNORMAL LOW (ref 80.0–100.0)
Platelets: 180 10*3/uL (ref 150–400)
RBC: 5.53 MIL/uL — ABNORMAL HIGH (ref 3.87–5.11)
RDW: 14.6 % (ref 11.5–15.5)
WBC: 6.2 10*3/uL (ref 4.0–10.5)
nRBC: 0 % (ref 0.0–0.2)

## 2021-04-22 LAB — TROPONIN I (HIGH SENSITIVITY): Troponin I (High Sensitivity): <2 ng/L (ref <18)

## 2021-04-22 NOTE — ED Triage Notes (Signed)
Pt in with co chest pressure that started prior to coming in. Pt has been here for the same in the past and all tests were wnl. Pt does have a hx of 1st EV block and a murmur. Pt is approx [redacted] weeks pregnant but not having any pregnancy complaints.

## 2021-04-22 NOTE — ED Notes (Signed)
Urine sent to the lab

## 2021-08-13 ENCOUNTER — Encounter: Payer: Self-pay | Admitting: Emergency Medicine

## 2021-08-13 DIAGNOSIS — R0602 Shortness of breath: Secondary | ICD-10-CM | POA: Diagnosis not present

## 2021-08-13 DIAGNOSIS — Z3A24 24 weeks gestation of pregnancy: Secondary | ICD-10-CM | POA: Insufficient documentation

## 2021-08-13 DIAGNOSIS — O26892 Other specified pregnancy related conditions, second trimester: Secondary | ICD-10-CM | POA: Insufficient documentation

## 2021-08-13 DIAGNOSIS — Z5321 Procedure and treatment not carried out due to patient leaving prior to being seen by health care provider: Secondary | ICD-10-CM | POA: Insufficient documentation

## 2021-08-13 DIAGNOSIS — R002 Palpitations: Secondary | ICD-10-CM | POA: Diagnosis not present

## 2021-08-13 DIAGNOSIS — R0981 Nasal congestion: Secondary | ICD-10-CM | POA: Diagnosis not present

## 2021-08-13 DIAGNOSIS — Z20822 Contact with and (suspected) exposure to covid-19: Secondary | ICD-10-CM | POA: Insufficient documentation

## 2021-08-13 NOTE — ED Triage Notes (Addendum)
Pt arrived via POV with reports of possible covid exposure from the weekend. Pt states she was around a family member on Friday and Saturday and was notified today that they are now COVID positive.  Pt c/o shortness of breath today, pt reports hx of anxiety, pt c/o congestion, denies any fevers. Pt reports sxs started today.  Pt is also approximately [redacted] weeks pregnant with no prenatal care. Pt denies any abd pain, vaginal bleeding, pt denies any pregnancy sxs at this time.  Pt also states earlier today she was having palpitations and heart rate was 120.

## 2021-08-14 ENCOUNTER — Encounter: Payer: Self-pay | Admitting: Emergency Medicine

## 2021-08-14 ENCOUNTER — Other Ambulatory Visit: Payer: Self-pay

## 2021-08-14 ENCOUNTER — Emergency Department
Admission: EM | Admit: 2021-08-14 | Discharge: 2021-08-14 | Disposition: A | Discharge status: Home / Self Care | Payer: Medicaid Other | Attending: Emergency Medicine | Admitting: Emergency Medicine

## 2021-08-14 ENCOUNTER — Emergency Department: Payer: Medicaid Other

## 2021-08-14 LAB — CBC
HCT: 35.5 % — ABNORMAL LOW (ref 36.0–46.0)
Hemoglobin: 12.1 g/dL (ref 12.0–15.0)
MCH: 26.1 pg (ref 26.0–34.0)
MCHC: 34.1 g/dL (ref 30.0–36.0)
MCV: 76.5 fL — ABNORMAL LOW (ref 80.0–100.0)
Platelets: 233 10*3/uL (ref 150–400)
RBC: 4.64 MIL/uL (ref 3.87–5.11)
RDW: 13.6 % (ref 11.5–15.5)
WBC: 6.6 10*3/uL (ref 4.0–10.5)
nRBC: 0 % (ref 0.0–0.2)

## 2021-08-14 LAB — BASIC METABOLIC PANEL
Anion gap: 5 (ref 5–15)
BUN: 10 mg/dL (ref 6–20)
CO2: 27 mmol/L (ref 22–32)
Calcium: 8.7 mg/dL — ABNORMAL LOW (ref 8.9–10.3)
Chloride: 104 mmol/L (ref 98–111)
Creatinine, Ser: 0.81 mg/dL (ref 0.44–1.00)
GFR, Estimated: >60 mL/min (ref >60)
Glucose, Bld: 77 mg/dL (ref 70–99)
Potassium: 4.1 mmol/L (ref 3.5–5.1)
Sodium: 136 mmol/L (ref 135–145)

## 2021-08-14 LAB — RESP PANEL BY RT-PCR (FLU A&B, COVID) ARPGX2
Influenza A by PCR: NEGATIVE
Influenza B by PCR: NEGATIVE
SARS Coronavirus 2 by RT PCR: NEGATIVE

## 2021-08-14 LAB — TROPONIN I (HIGH SENSITIVITY): Troponin I (High Sensitivity): 3 ng/L (ref <18)

## 2021-08-14 NOTE — ED Notes (Signed)
No answer when called several times from lobby 

## 2021-09-20 ENCOUNTER — Encounter: Payer: Self-pay | Admitting: Emergency Medicine

## 2021-09-20 ENCOUNTER — Other Ambulatory Visit: Payer: Self-pay

## 2021-09-20 ENCOUNTER — Emergency Department: Payer: Medicaid Other

## 2021-09-20 DIAGNOSIS — F418 Other specified anxiety disorders: Secondary | ICD-10-CM | POA: Diagnosis not present

## 2021-09-20 DIAGNOSIS — R002 Palpitations: Secondary | ICD-10-CM | POA: Insufficient documentation

## 2021-09-20 DIAGNOSIS — N939 Abnormal uterine and vaginal bleeding, unspecified: Secondary | ICD-10-CM | POA: Diagnosis not present

## 2021-09-20 DIAGNOSIS — R0789 Other chest pain: Secondary | ICD-10-CM | POA: Diagnosis not present

## 2021-09-20 DIAGNOSIS — Z20822 Contact with and (suspected) exposure to covid-19: Secondary | ICD-10-CM | POA: Diagnosis not present

## 2021-09-20 DIAGNOSIS — R059 Cough, unspecified: Secondary | ICD-10-CM | POA: Insufficient documentation

## 2021-09-20 DIAGNOSIS — R519 Headache, unspecified: Secondary | ICD-10-CM | POA: Insufficient documentation

## 2021-09-20 DIAGNOSIS — R030 Elevated blood-pressure reading, without diagnosis of hypertension: Secondary | ICD-10-CM | POA: Diagnosis not present

## 2021-09-20 DIAGNOSIS — R079 Chest pain, unspecified: Secondary | ICD-10-CM | POA: Diagnosis present

## 2021-09-20 LAB — BASIC METABOLIC PANEL
Anion gap: 9 (ref 5–15)
BUN: 13 mg/dL (ref 6–20)
CO2: 25 mmol/L (ref 22–32)
Calcium: 9.1 mg/dL (ref 8.9–10.3)
Chloride: 106 mmol/L (ref 98–111)
Creatinine, Ser: 1.09 mg/dL — ABNORMAL HIGH (ref 0.44–1.00)
GFR, Estimated: >60 mL/min (ref >60)
Glucose, Bld: 96 mg/dL (ref 70–99)
Potassium: 4.2 mmol/L (ref 3.5–5.1)
Sodium: 140 mmol/L (ref 135–145)

## 2021-09-20 LAB — CBC
HCT: 40.3 % (ref 36.0–46.0)
Hemoglobin: 13.2 g/dL (ref 12.0–15.0)
MCH: 25.3 pg — ABNORMAL LOW (ref 26.0–34.0)
MCHC: 32.8 g/dL (ref 30.0–36.0)
MCV: 77.2 fL — ABNORMAL LOW (ref 80.0–100.0)
Platelets: 311 10*3/uL (ref 150–400)
RBC: 5.22 MIL/uL — ABNORMAL HIGH (ref 3.87–5.11)
RDW: 12.9 % (ref 11.5–15.5)
WBC: 5.8 10*3/uL (ref 4.0–10.5)
nRBC: 0 % (ref 0.0–0.2)

## 2021-09-20 LAB — TROPONIN I (HIGH SENSITIVITY): Troponin I (High Sensitivity): <2 ng/L (ref <18)

## 2021-09-20 NOTE — ED Triage Notes (Signed)
Pt presents to ER c/o chest pain x 2 weeks.  Pt dx with miscarriage at 28 weeks and her symptoms started around then.  Pt appears very depressed and tearful in triage.  Pt states she also has a splitting HA at this time.  Pt states chest pain is sharp and pressure-like in nature.  Pt A&O x4 at this time.

## 2021-09-21 ENCOUNTER — Emergency Department
Admission: EM | Admit: 2021-09-21 | Discharge: 2021-09-21 | Disposition: A | Discharge status: Home / Self Care | Payer: Medicaid Other | Attending: Emergency Medicine | Admitting: Emergency Medicine

## 2021-09-21 DIAGNOSIS — R079 Chest pain, unspecified: Secondary | ICD-10-CM

## 2021-09-21 DIAGNOSIS — F32A Depression, unspecified: Secondary | ICD-10-CM

## 2021-09-21 DIAGNOSIS — R519 Headache, unspecified: Secondary | ICD-10-CM

## 2021-09-21 LAB — TROPONIN I (HIGH SENSITIVITY): Troponin I (High Sensitivity): <2 ng/L (ref <18)

## 2021-09-21 LAB — RESP PANEL BY RT-PCR (FLU A&B, COVID) ARPGX2
Influenza A by PCR: NEGATIVE
Influenza B by PCR: NEGATIVE
SARS Coronavirus 2 by RT PCR: NEGATIVE

## 2021-09-21 NOTE — ED Provider Notes (Signed)
[facility] Emergency Department Provider Note  ____________________________________________   Event Date/Time   First MD Initiated Contact with Patient 09/21/21 [phone]     (approximate)  I have reviewed the triage vital signs and the nursing notes.   HISTORY  Chief Complaint Chest Pain   HPI Lindsey Morgan is a 30 y.o. female with a past medical history of migraines and first-degree AV block and recent miscarriage approximately 2 weeks ago at [redacted] weeks gestation who presents with several concerns.  Patient states that since this time she has had some intermittent palpitations and sharp chest pains lasting a few seconds in nature.  She also states she developed a mild nonproductive cough of the past day but thinks it may picked up from her son recently returned to school.  She states she is also having intermittent headaches and had 1 while waiting in the emergency room lobby but did not have initially 1 earlier today and has since resolved.  She states she still has a little bit of vaginal spotting but has not had any new abdominal pain, back pain, burning with urination, other abnormal discharge, cough, shortness of breath, vision changes, vertigo, earache, sore throat or other acute sick symptoms.  She states she has had difficulty focusing and has been feeling somewhat depressed since her recent miscarriage and thinks this is making her anxiety much worse.  He does not currently follow with a psychiatrist or therapist and has not had a chance to talk to anyone about her feelings since her miscarriage.  She is not suicidal or homicidal.  She denies any illicit use or EtOH.         Past Medical History:  Diagnosis Date   AV block    Migraines     Patient Active Problem List   Diagnosis Date Noted   Normal labor 11/21/2018   No prenatal care in current pregnancy 11/21/2018   Indication for care in labor or delivery 10/08/2018   Normal labor and delivery  03/03/2017   NSVD (normal spontaneous vaginal delivery) 03/03/2017   Labor and delivery, indication for care 04/28/2015   Supervision of normal pregnancy in third trimester 04/27/2015    Past Surgical History:  Procedure Laterality Date   WISDOM TOOTH EXTRACTION      Prior to Admission medications   Medication Sig Start Date End Date Taking? Authorizing Provider  Multiple Vitamins-Calcium (ONE-A-DAY WOMENS FORMULA PO) Take by mouth.    [provider]  phentermine 37.5 MG capsule Take 37.5 mg by mouth every morning.    [provider]  Prenatal Vit-Fe Fumarate-FA (MULTIVITAMIN-PRENATAL) 27-0.8 MG TABS tablet Take 1 tablet by mouth daily at 12 noon. 05/28/20   Elpidio Anis, PA-C  topiramate (TOPAMAX) 25 MG capsule Take 25 mg by mouth 2 (two) times daily.    [provider]    Allergies Mushroom extract complex  Family History  Problem Relation Age of Onset   Diabetes Mother     Social History Social History   Tobacco Use   Smoking status: Never   Smokeless tobacco: Never  Vaping Use   Vaping Use: Never used  Substance Use Topics   Alcohol use: Yes    Comment: occ   Drug use: No    Review of Systems  Review of Systems  Constitutional:  Negative for chills and fever.  HENT:  Negative for sore throat.   Eyes:  Negative for pain.  Respiratory:  Positive for cough. Negative for stridor.  Cardiovascular:  Positive for chest pain and palpitations.  Gastrointestinal:  Negative for vomiting.  Genitourinary:  Negative for dysuria.  Musculoskeletal:  Negative for myalgias.  Skin:  Negative for rash.  Neurological:  Positive for headaches. Negative for seizures and loss of consciousness.  Psychiatric/Behavioral:  Positive for depression. Negative for substance abuse and suicidal ideas. The patient has insomnia.   All other systems reviewed and are negative.    ____________________________________________   PHYSICAL EXAM:  VITAL SIGNS: ED  Triage Vitals  Enc Vitals Group     BP 09/20/21 2035 (!) 142/104     Pulse Rate 09/20/21 2035 83     Resp 09/20/21 2035 18     Temp 09/20/21 2035 98.2 F (36.8 C)     Temp Source 09/20/21 2035 Oral     SpO2 09/20/21 2035 100 %     Weight 09/20/21 2036 204 lb (92.5 kg)     Height 09/20/21 2036 5\' 5"  (1.651 m)     Head Circumference --      Peak Flow --      Pain Score 09/20/21 2035 7     Pain Loc --      Pain Edu? --      Excl. in GC? --    Vitals:   09/21/21 0147 09/21/21 0430  BP: 123/72 122/72  Pulse: 65 (!) 59  Resp: 17 16  Temp:    SpO2: 100% 100%   Physical Exam Vitals and nursing note reviewed.  Constitutional:      General: She is not in acute distress.    Appearance: She is well-developed.  HENT:     Head: Normocephalic and atraumatic.     Right Ear: External ear normal.     Left Ear: External ear normal.     Nose: Nose normal.     Mouth/Throat:     Mouth: Mucous membranes are moist.  Eyes:     Conjunctiva/sclera: Conjunctivae normal.  Cardiovascular:     Rate and Rhythm: Normal rate and regular rhythm.     Heart sounds: No murmur heard. Pulmonary:     Effort: Pulmonary effort is normal. No respiratory distress.     Breath sounds: Normal breath sounds.  Abdominal:     Palpations: Abdomen is soft.     Tenderness: There is no abdominal tenderness.  Musculoskeletal:     Cervical back: Neck supple.  Skin:    General: Skin is warm and dry.     Capillary Refill: Capillary refill takes less than 2 seconds.  Neurological:     Mental Status: She is alert and oriented to person, place, and time.  Psychiatric:        Mood and Affect: Mood is anxious and depressed.        Thought Content: Thought content does not include homicidal or suicidal ideation.     ____________________________________________   LABS (all labs ordered are listed, but only abnormal results are displayed)  Labs Reviewed  BASIC METABOLIC PANEL - Abnormal; Notable for the following  components:      Result Value   Creatinine, Ser 1.09 (*)    All other components within normal limits  CBC - Abnormal; Notable for the following components:   RBC 5.22 (*)    MCV 77.2 (*)    MCH 25.3 (*)    All other components within normal limits  RESP PANEL BY RT-PCR (FLU A&B, COVID) ARPGX2  POC URINE PREG, ED  TROPONIN I (HIGH SENSITIVITY)  TROPONIN I (  HIGH SENSITIVITY)   ____________________________________________  EKG  ECG shows normal sinus rhythm with a ventricular rate of 83, normal axis, unremarkable intervals without clear evidence of acute ischemia or significant arrhythmia. ____________________________________________  RADIOLOGY  ED MD interpretation: Chest x-ray shows no focal consolidations, effusion, edema, pneumothorax or any other clear acute intrathoracic process.  Official radiology report(s): DG Chest 2 View  Result Date: 09/20/2021 CLINICAL DATA:  Chest pain for 2 weeks. EXAM: CHEST - 2 VIEW COMPARISON:  Radiograph 08/14/2021 FINDINGS: The cardiomediastinal contours are normal. The lungs are clear. Pulmonary vasculature is normal. No consolidation, pleural effusion, or pneumothorax. No acute osseous abnormalities are seen. IMPRESSION: Negative radiographs of the chest. Electronically Signed   By: Narda Rutherford M.D.   On: 09/20/2021 20:51    ____________________________________________   PROCEDURES  Procedure(s) performed (including Critical Care):  Procedures   ____________________________________________   INITIAL IMPRESSION / ASSESSMENT AND PLAN / ED COURSE     Patient presents with above-stated history exam complaining of some intermittent sharp chest pain lasting a few seconds on and off over the last 2 weeks since recent miscarriage associate with 1 day of mild nonproductive cough, some persistent vaginal spotting and intermittent headaches.  She states her headache is now resolved discussing with this examiner.  On arrival she was noted  hypertensive with BP of 142/104 with otherwise stable vital signs however on my assessment her BP is 120/72.  With regard to her headache certainly possible stress related to some transient elevated blood pressures related in the ED.  She has a nonfocal neuro exam myelosuppression for CVA or venous sinus thrombosis given complete resolution without intervention.  No findings on history or exam to suggest deep space infection head or neck or hypertensive emergency at this time.  Given she is feeling better with stable vitals and a nonfocal neurological exam I think she can follow-up with her PCP for this.  With regard to her intermittent sharp chest discomfort lasting few seconds on and off for the last 2 weeks unclear etiology.  While she is recently postpartum and overall description without any associated chest pain and no evidence of hypoxia, tachycardia or tachypnea have a low suspicion for PE at this time.  While she states she developed a mild nonproductive cough yesterday I suspect some mild probably a viral bronchitis.  There are no abnormal breath sounds or history or findings of fever to suggest bacterial pneumonia at this time.  Chest x-ray has no focal consolidations, effusion, edema, pneumothorax or any other acute thoracic process.  ECG and nonelevated troponin are not suggestive of atypical presentation for ACS or myocarditis.  CBC shows no leukocytosis or acute anemia.  BMP shows no significant electrolyte metabolic derangements.  Suspect patient's other symptoms are likely related to grief and anxiety related to her recent miscarriage.  Discussed with patient recommendation for outpatient follow-up with PCP and [facility] to discuss whether she needs to start seeing a therapist or initiate treatment.  Do not believe she requires emergent psychiatric evaluation as she is not suicidal homicidal or psychotic.  She is amenable with plan.  Will swab for COVID which she can follow-up outpatient.  She has  no other acute concerns at this time.  Discharged stable condition.  Strict return precautions advised and discussed.    ____________________________________________   FINAL CLINICAL IMPRESSION(S) / ED DIAGNOSES  Final diagnoses:  Chest pain, unspecified type  Nonintractable headache, unspecified chronicity pattern, unspecified headache type  Depression, unspecified depression type  Medications - No data to display   ED Discharge Orders     None        Note:  This document was prepared using Dragon voice recognition software and may include unintentional dictation errors.    Gilles Chiquito, MD 09/21/21 [phone]

## 2022-01-11 ENCOUNTER — Emergency Department
Admission: EM | Admit: 2022-01-11 | Discharge: 2022-01-12 | Disposition: A | Discharge status: Home / Self Care | Payer: Medicaid Other | Attending: Emergency Medicine | Admitting: Emergency Medicine

## 2022-01-11 DIAGNOSIS — F410 Panic disorder [episodic paroxysmal anxiety] without agoraphobia: Secondary | ICD-10-CM | POA: Diagnosis not present

## 2022-01-11 DIAGNOSIS — R079 Chest pain, unspecified: Secondary | ICD-10-CM | POA: Diagnosis not present

## 2022-01-11 LAB — BASIC METABOLIC PANEL
Anion gap: 6 (ref 5–15)
BUN: 10 mg/dL (ref 6–20)
CO2: 29 mmol/L (ref 22–32)
Calcium: 8.6 mg/dL — ABNORMAL LOW (ref 8.9–10.3)
Chloride: 103 mmol/L (ref 98–111)
Creatinine, Ser: 0.93 mg/dL (ref 0.44–1.00)
GFR, Estimated: >60 mL/min (ref >60)
Glucose, Bld: 123 mg/dL — ABNORMAL HIGH (ref 70–99)
Potassium: 3.8 mmol/L (ref 3.5–5.1)
Sodium: 138 mmol/L (ref 135–145)

## 2022-01-11 LAB — CBC
HCT: 42 % (ref 36.0–46.0)
Hemoglobin: 13.2 g/dL (ref 12.0–15.0)
MCH: 23.3 pg — ABNORMAL LOW (ref 26.0–34.0)
MCHC: 31.4 g/dL (ref 30.0–36.0)
MCV: 74.2 fL — ABNORMAL LOW (ref 80.0–100.0)
Platelets: 203 10*3/uL (ref 150–400)
RBC: 5.66 MIL/uL — ABNORMAL HIGH (ref 3.87–5.11)
RDW: 15.5 % (ref 11.5–15.5)
WBC: 6.1 10*3/uL (ref 4.0–10.5)
nRBC: 0 % (ref 0.0–0.2)

## 2022-01-11 LAB — TROPONIN I (HIGH SENSITIVITY): Troponin I (High Sensitivity): <2 ng/L (ref <18)

## 2022-01-11 MED ORDER — ONDANSETRON 4 MG PO TBDP
4.0000 mg | ORAL_TABLET | Freq: Once | ORAL | Status: AC
  Administered 2022-01-12: 4 mg via ORAL
  Filled 2022-01-11: qty 1

## 2022-01-11 NOTE — ED Triage Notes (Signed)
Pt started having left sided chest pain while driving to work tonight. Feels like a burning pressure type pain. Denies shortness of breath at this time.

## 2022-01-12 ENCOUNTER — Other Ambulatory Visit: Payer: Medicaid Other

## 2022-01-12 ENCOUNTER — Emergency Department: Payer: Medicaid Other

## 2022-01-12 LAB — TROPONIN I (HIGH SENSITIVITY): Troponin I (High Sensitivity): 2 ng/L (ref <18)

## 2022-01-12 LAB — HCG, QUANTITATIVE, PREGNANCY: hCG, Beta Chain, Quant, S: <1 m[IU]/mL (ref <5)

## 2022-01-12 NOTE — ED Provider Notes (Signed)
[facility] Provider Note    Event Date/Time   First MD Initiated Contact with Patient 01/11/22 2351     (approximate)   History   Chest Pain   HPI  Lindsey Morgan is a 31 y.o. female with a history of migraine headaches and AV block who presents for evaluation of chest pain.  Patient reports having a panic attack on her way to work.  She reports that over the last year she has had daily panic attacks.  She reports that she is not sure exactly what triggers them but she will feel very anxious, sometimes she will hyperventilate.  She then starts feeling like her heart is racing, that makes her even more anxious and sometimes she develops left-sided burning chest pain which she had this evening.  She was driving to work.  She pulled over.  She reports that she felt [place] and better when someone pulled over and called 911 for her.  Upon arrival to the emergency room, patient reports that her symptoms fully resolved.  Patient reports that her PCP has ordered Zoloft and Xanax for her but she has not picked up the prescriptions yet because she does not like taking medications.  She denies any history of smoking, history of heart disease, history of PE or DVT, recent travel immobilization, leg pain or swelling, hemoptysis or exogenous hormones.     Past Medical History:  Diagnosis Date   AV block    Migraines     Past Surgical History:  Procedure Laterality Date   WISDOM TOOTH EXTRACTION       Physical Exam   Triage Vital Signs: ED Triage Vitals  Enc Vitals Group     BP 01/11/22 2219 139/72     Pulse Rate 01/11/22 2219 78     Resp 01/11/22 2219 16     Temp 01/11/22 2219 98 F (36.7 C)     Temp Source 01/11/22 2219 Oral     SpO2 01/11/22 2219 100 %     Weight --      Height --      Head Circumference --      Peak Flow --      Pain Score 01/11/22 2220 7     Pain Loc --      Pain Edu? --      Excl. in GC? --     Most recent vital  signs: Vitals:   01/12/22 0045 01/12/22 0100  BP: 109/67 (!) 111/59  Pulse: 65 64  Resp:  16  Temp:    SpO2: 99% 99%     Constitutional: Alert and oriented. Well appearing and in no apparent distress. HEENT:      Head: Normocephalic and atraumatic.         Eyes: Conjunctivae are normal. Sclera is non-icteric.       Mouth/Throat: Mucous membranes are moist.       Neck: Supple with no signs of meningismus. Cardiovascular: Regular rate and rhythm. No murmurs, gallops, or rubs. 2+ symmetrical distal pulses are present in all extremities.  Respiratory: Normal respiratory effort. Lungs are clear to auscultation bilaterally.  Gastrointestinal: Soft, non tender, and non distended with positive bowel sounds. No rebound or guarding. Genitourinary: No CVA tenderness. Musculoskeletal:  No edema, cyanosis, or erythema of extremities. Neurologic: Normal speech and language. Face is symmetric. Moving all extremities. No gross focal neurologic deficits are appreciated. Skin: Skin is warm, dry and intact. No rash noted. Psychiatric: Mood and affect are  normal. Speech and behavior are normal.  ED Results / Procedures / Treatments   Labs (all labs ordered are listed, but only abnormal results are displayed) Labs Reviewed  BASIC METABOLIC PANEL - Abnormal; Notable for the following components:      Result Value   Glucose, Bld 123 (*)    Calcium 8.6 (*)    All other components within normal limits  CBC - Abnormal; Notable for the following components:   RBC 5.66 (*)    MCV 74.2 (*)    MCH 23.3 (*)    All other components within normal limits  HCG, QUANTITATIVE, PREGNANCY  POC URINE PREG, ED  TROPONIN I (HIGH SENSITIVITY)  TROPONIN I (HIGH SENSITIVITY)     EKG  ED ECG REPORT I, Nita Sickle, the attending physician, personally viewed and interpreted this ECG.  Sinus rhythm with first-degree AV block, no ST elevations or depressions.  Unchanged from prior   RADIOLOGY  I,  Nita Sickle, attending MD, have personally viewed and interpreted the images obtained during this visit as below:  Chest x-ray negative for any acute findings   ___________________________________________________ Interpretation by Radiologist:  DG Chest Portable 1 View  Result Date: 01/12/2022 CLINICAL DATA:  CP EXAM: PORTABLE CHEST 1 VIEW COMPARISON:  Chest x-ray 09/20/2021. FINDINGS: The heart and mediastinal contours are within normal limits. No focal consolidation. No pulmonary edema. No pleural effusion. No pneumothorax. No acute osseous abnormality. IMPRESSION: No active disease. Electronically Signed   By: Tish Frederickson M.D.   On: 01/12/2022 00:14     PROCEDURES:  Critical Care performed: No  Procedures    IMPRESSION / MDM / ASSESSMENT AND PLAN / ED COURSE  I reviewed the triage vital signs and the nursing notes.  31 y.o. female with a history of migraine headaches and AV block who presents for evaluation of chest pain.  She reports 1 episode of feeling extremely anxious while driving to work associated with palpitations and burning in the left side of her chest.  Symptoms resolved upon arrival to the emergency room.  She has been having these episodes almost on a daily basis for over a year.  Has been seen by her PCP and supposed to be on Zoloft and Xanax but has not filled the prescriptions yet.  She is asymptomatic with nonfocal exam.  Complaining of mild nausea at this time.  Ddx: Anxiety attack versus ACS versus GERD/indigestion versus arrhythmia.  She is PERC negative and with fully resolved symptoms.   Plan: Cardiac monitoring, EKG, CBC, metabolic panel, troponin x2.  Will give Zofran for the nausea   MEDICATIONS GIVEN IN ED: Medications  ondansetron (ZOFRAN-ODT) disintegrating tablet 4 mg (4 mg Oral Given 01/12/22 0006)     ED COURSE: EKG showed first-degree AV block which is known.  2 high-sensitivity troponins are negative.  No significant electrolyte  derangements, no leukocytosis, no anemia, negative pregnancy test.  Chest x-ray with no acute findings.  Patient was monitored for several hours with no signs of dysrhythmias on telemetry.  No further episodes of chest pain.  Admission was considered but felt unnecessary with a negative work-up and fully resolved symptoms.  I did discuss the importance of starting the Zoloft as prescribed especially since patient has been having these episodes almost on a daily basis.  We discussed close follow-up with primary care doctor and discussed my standard return precautions.   Consults: None   EMR reviewed including several prior visits to the emergency room for similar symptoms.  And her last note from cardiology from 2021 when she was seen for palpitations at first-degree.  I also reviewed results of patient's Holter monitor from 2021 which did not show any signs of malignant arrhythmia    FINAL CLINICAL IMPRESSION(S) / ED DIAGNOSES   Final diagnoses:  Chest pain, unspecified type  Anxiety attack     Rx / DC Orders   ED Discharge Orders     None        Note:  This document was prepared using Dragon voice recognition software and may include unintentional dictation errors.   Please note:  Patient was evaluated in Emergency Department today for the symptoms described in the history of present illness. Patient was evaluated in the context of the global COVID-19 pandemic, which necessitated consideration that the patient might be at risk for infection with the SARS-CoV-2 virus that causes COVID-19. Institutional protocols and algorithms that pertain to the evaluation of patients at risk for COVID-19 are in a state of rapid change based on information released by regulatory bodies including the CDC and federal and state organizations. These policies and algorithms were followed during the patient's care in the ED.  Some ED evaluations and interventions may be delayed as a result of limited  staffing during the pandemic.       Don Perking, [place], MD 01/12/22 [phone]

## 2022-01-12 NOTE — Discharge Instructions (Addendum)

## 2022-03-25 ENCOUNTER — Encounter: Payer: Self-pay | Admitting: Emergency Medicine

## 2022-03-25 ENCOUNTER — Emergency Department
Admission: EM | Admit: 2022-03-25 | Discharge: 2022-03-25 | Disposition: A | Discharge status: Home / Self Care | Payer: Medicaid Other | Attending: Emergency Medicine | Admitting: Emergency Medicine

## 2022-03-25 DIAGNOSIS — O99340 Other mental disorders complicating pregnancy, unspecified trimester: Secondary | ICD-10-CM | POA: Diagnosis present

## 2022-03-25 DIAGNOSIS — R0789 Other chest pain: Secondary | ICD-10-CM | POA: Diagnosis not present

## 2022-03-25 DIAGNOSIS — O26899 Other specified pregnancy related conditions, unspecified trimester: Secondary | ICD-10-CM | POA: Insufficient documentation

## 2022-03-25 DIAGNOSIS — N3000 Acute cystitis without hematuria: Secondary | ICD-10-CM | POA: Diagnosis not present

## 2022-03-25 DIAGNOSIS — F332 Major depressive disorder, recurrent severe without psychotic features: Secondary | ICD-10-CM | POA: Diagnosis present

## 2022-03-25 DIAGNOSIS — Z3490 Encounter for supervision of normal pregnancy, unspecified, unspecified trimester: Secondary | ICD-10-CM

## 2022-03-25 DIAGNOSIS — Z3A00 Weeks of gestation of pregnancy not specified: Secondary | ICD-10-CM | POA: Insufficient documentation

## 2022-03-25 DIAGNOSIS — F419 Anxiety disorder, unspecified: Secondary | ICD-10-CM | POA: Diagnosis not present

## 2022-03-25 DIAGNOSIS — F410 Panic disorder [episodic paroxysmal anxiety] without agoraphobia: Secondary | ICD-10-CM | POA: Diagnosis present

## 2022-03-25 LAB — URINALYSIS, COMPLETE (UACMP) WITH MICROSCOPIC
Bilirubin Urine: NEGATIVE
Glucose, UA: NEGATIVE mg/dL
Hgb urine dipstick: NEGATIVE
Ketones, ur: NEGATIVE mg/dL
Leukocytes,Ua: NEGATIVE
Nitrite: POSITIVE — AB
Protein, ur: NEGATIVE mg/dL
Specific Gravity, Urine: 1.016 (ref 1.005–1.030)
pH: 7 (ref 5.0–8.0)

## 2022-03-25 LAB — COMPREHENSIVE METABOLIC PANEL
ALT: 21 U/L (ref 0–44)
AST: 23 U/L (ref 15–41)
Albumin: 4 g/dL (ref 3.5–5.0)
Alkaline Phosphatase: 50 U/L (ref 38–126)
Anion gap: 9 (ref 5–15)
BUN: 10 mg/dL (ref 6–20)
CO2: 26 mmol/L (ref 22–32)
Calcium: 9.4 mg/dL (ref 8.9–10.3)
Chloride: 104 mmol/L (ref 98–111)
Creatinine, Ser: 0.85 mg/dL (ref 0.44–1.00)
GFR, Estimated: >60 mL/min (ref >60)
Glucose, Bld: 115 mg/dL — ABNORMAL HIGH (ref 70–99)
Potassium: 3.8 mmol/L (ref 3.5–5.1)
Sodium: 139 mmol/L (ref 135–145)
Total Bilirubin: 0.7 mg/dL (ref 0.3–1.2)
Total Protein: 7.1 g/dL (ref 6.5–8.1)

## 2022-03-25 LAB — CBC WITH DIFFERENTIAL/PLATELET
Abs Immature Granulocytes: 0.03 10*3/uL (ref 0.00–0.07)
Basophils Absolute: 0 10*3/uL (ref 0.0–0.1)
Basophils Relative: 0 %
Eosinophils Absolute: 0 10*3/uL (ref 0.0–0.5)
Eosinophils Relative: 1 %
HCT: 42 % (ref 36.0–46.0)
Hemoglobin: 13.4 g/dL (ref 12.0–15.0)
Immature Granulocytes: 1 %
Lymphocytes Relative: 18 %
Lymphs Abs: 1.1 10*3/uL (ref 0.7–4.0)
MCH: 23.8 pg — ABNORMAL LOW (ref 26.0–34.0)
MCHC: 31.9 g/dL (ref 30.0–36.0)
MCV: 74.7 fL — ABNORMAL LOW (ref 80.0–100.0)
Monocytes Absolute: 0.3 10*3/uL (ref 0.1–1.0)
Monocytes Relative: 5 %
Neutro Abs: 4.6 10*3/uL (ref 1.7–7.7)
Neutrophils Relative %: 75 %
Platelets: 271 10*3/uL (ref 150–400)
RBC: 5.62 MIL/uL — ABNORMAL HIGH (ref 3.87–5.11)
RDW: 13.9 % (ref 11.5–15.5)
WBC: 6.1 10*3/uL (ref 4.0–10.5)
nRBC: 0 % (ref 0.0–0.2)

## 2022-03-25 LAB — POC URINE PREG, ED: Preg Test, Ur: POSITIVE — AB

## 2022-03-25 LAB — TROPONIN I (HIGH SENSITIVITY): Troponin I (High Sensitivity): 3 ng/L (ref <18)

## 2022-03-25 LAB — PREGNANCY, URINE: Preg Test, Ur: POSITIVE — AB

## 2022-03-25 MED ORDER — DOXYLAMINE-PYRIDOXINE 10-10 MG PO TBEC
1.0000 | DELAYED_RELEASE_TABLET | Freq: Two times a day (BID) | ORAL | 0 refills | Status: AC | PRN
Start: 2022-03-25 — End: 2022-04-08

## 2022-03-25 MED ORDER — CEPHALEXIN 500 MG PO CAPS
500.0000 mg | ORAL_CAPSULE | Freq: Four times a day (QID) | ORAL | 0 refills | Status: AC
Start: 2022-03-25 — End: 2022-04-01

## 2022-03-25 MED ORDER — VITAMIN B-6 50 MG PO TABS
100.0000 mg | ORAL_TABLET | ORAL | Status: AC
  Administered 2022-03-25: 100 mg via ORAL
  Filled 2022-03-25: qty 2

## 2022-03-25 MED ORDER — DOXYLAMINE SUCCINATE (SLEEP) 25 MG PO TABS
25.0000 mg | ORAL_TABLET | ORAL | Status: AC
  Administered 2022-03-25: 25 mg via ORAL
  Filled 2022-03-25: qty 1

## 2022-03-25 NOTE — ED Provider Notes (Signed)
? ?[facility] ?Provider Note ? ? ? Event Date/Time  ? First MD Initiated Contact with Patient 03/25/22 1930   ?  (approximate) ? ? ?History  ? ?Anxiety ? ? ?HPI ? ?Lindsey Morgan is a 31 y.o. female [phone] with a past medical history of anxiety previously prescribed Zoloft which she states she did not pick it up when she was diagnosed with depression following a miscarriage in October of last year who presents for evaluation of worsening anxiety.  Patient states he has had anxiety attacks intermittently over the last couple weeks but she feels they are becoming more intense and throughout the day.  She cannot identify any other specific stressors at this time.  She states she will sometimes have nausea and racing heart and some chest tightness but otherwise no fevers, cough, shortness of breath, abdominal pain vomiting or urinary symptoms.  She states she started taking some Zoloft again that was left over from family number and went to see her PCP today who put her prescription but then told her not to take it when she was found to have a positive pregnancy test.  She states her last menstrual period was sometime last month although she is not exactly sure when.  She was not expected to be pregnant.  She denies any other acute concerns at this time.  Denies any SI HI or hallucinations.  Denies any tobacco use illicit drug use or EtOH use. ? ? ?Past Medical History:  ?Diagnosis Date  ? AV block   ? Migraines   ? ? ? ?  ? ? ?Physical Exam  ?Triage Vital Signs: ?ED Triage Vitals [03/25/22 1925]  ?Enc Vitals Group  ?   BP (!) 141/90  ?   Pulse Rate 95  ?   Resp 18  ?   Temp 98.5 ?F (36.9 ?C)  ?   Temp Source Oral  ?   SpO2 100 %  ?   Weight 195 lb (88.5 kg)  ?   Height 5\' 5"  (1.651 m)  ?   Head Circumference   ?   Peak Flow   ?   Pain Score 0  ?   Pain Loc   ?   Pain Edu?   ?   Excl. in [place]?   ? ? ?Most recent vital signs: ?Vitals:  ? 03/25/22 1925 03/25/22 2325  ?BP: (!) 141/90 113/65   ?Pulse: 95 73  ?Resp: 18 16  ?Temp: 98.5 ?F (36.9 ?C) 98.5 ?F (36.9 ?C)  ?SpO2: 100% 100%  ? ? ?General: Awake, no distress.  ?CV:  Good peripheral perfusion.  2+ radial pulses ?Resp:  Normal effort.  Clear bilaterally. ?Abd:  No distention.  Soft throughout. ?Other:  Patient seems anxious but is denying any SI HI or hallucinations.  Does not seem intoxicated ? ? ?ED Results / Procedures / Treatments  ?Labs ?(all labs ordered are listed, but only abnormal results are displayed) ?Labs Reviewed  ?URINALYSIS, COMPLETE (UACMP) WITH MICROSCOPIC - Abnormal; Notable for the following components:  ?    Result Value  ? Color, Urine YELLOW (*)   ? APPearance HAZY (*)   ? Nitrite POSITIVE (*)   ? Bacteria, UA RARE (*)   ? All other components within normal limits  ?PREGNANCY, URINE - Abnormal; Notable for the following components:  ? Preg Test, Ur POSITIVE (*)   ? All other components within normal limits  ?CBC WITH DIFFERENTIAL/PLATELET - Abnormal; Notable for the following components:  ?  RBC 5.62 (*)   ? MCV 74.7 (*)   ? MCH 23.8 (*)   ? All other components within normal limits  ?COMPREHENSIVE METABOLIC PANEL - Abnormal; Notable for the following components:  ? Glucose, Bld 115 (*)   ? All other components within normal limits  ?POC URINE PREG, ED - Abnormal; Notable for the following components:  ? Preg Test, Ur POSITIVE (*)   ? All other components within normal limits  ?URINE CULTURE  ?TROPONIN I (HIGH SENSITIVITY)  ? ? ? ?EKG ? ?ECG she is remarkable for sinus rhythm with first-degree block with a.  First-degree block at 236, ventricular rate of 77, otherwise unremarkable intervals with some benign early repole in inferior lateral leads without ? ? ?RADIOLOGY ? ? ? ?PROCEDURES: ? ?Critical Care performed: No ? ?Procedures ? ? ? ?MEDICATIONS ORDERED IN ED: ?Medications  ?doxylamine (Sleep) (UNISOM) tablet 25 mg (25 mg Oral Given 03/25/22 2208)  ?pyridOXINE (VITAMIN B-6) tablet 100 mg (100 mg Oral Given 03/25/22 2209)   ? ? ? ?IMPRESSION / MDM / ASSESSMENT AND PLAN / ED COURSE  ?I reviewed the triage vital signs and the nursing notes. ?             ?               ? ?Differential diagnosis includes, but is not limited to arrhythmia, ACS, GERD, anemia, metabolic derangements, anxiety and some nausea related to pregnancy.  Patient describes palpitations and chest tightness but no shortness of breath and has no fever or abnormal breath sounds to suggest pneumonia, CHF or pneumothorax and I have low suspicion for PE at this time. ? ?ECG and nonelevated troponin are not suggestive of ACS or myocarditis.  No significant arrhythmia identified.  CMP shows no significant electrolyte or metabolic derangements.  CBC without leukocytosis or acute anemia.  UPT confirmed today.  UA is concerning for cystitis with positive nitrites and rare bacteria noted.  Urine culture sent.  We will treat with a course of Keflex. ? ?Patient is feeling better on my reassessment.  I have low suspicion for immediate life-threatening process in regards to her anxiety encouraged her to take this seriously follow-up with [facility] to speak with a therapist receive possible referral to psychiatry.  Regard to the cystitis we will treat with a course of Keflex and have patient follow-up with OB.  She has no other acute concerns at this time.  Discharged in stable condition. ? ?  ? ? ?FINAL CLINICAL IMPRESSION(S) / ED DIAGNOSES  ? ?Final diagnoses:  ?Anxiety  ?Pregnancy, unspecified gestational age  ?Acute cystitis without hematuria  ? ? ? ?Rx / DC Orders  ? ?ED Discharge Orders   ? ?      Ordered  ?  Doxylamine-Pyridoxine (DICLEGIS) 10-10 MG TBEC  2 times daily PRN       ? 03/25/22 2310  ?  cephALEXin (KEFLEX) 500 MG capsule  4 times daily       ? 03/25/22 2310  ? ?  ?  ? ?  ? ? ? ?Note:  This document was prepared using Dragon voice recognition software and may include unintentional dictation errors. ?  ?Lucrezia Starch, MD ?03/25/22 2333 ? ?

## 2022-03-25 NOTE — ED Notes (Signed)
Pt received night time snack and is resting in hallway bed until doctor Katrinka Blazing discharges ?

## 2022-03-25 NOTE — ED Triage Notes (Addendum)
Pt presents via POV with anxiety and panic attacks. She notes having a miscarriage in October 2022 and she found out she was pregnant today but is unsure how far along she is. Hx of anxiety and was prescribed Zoloft back in November but never started taking that prescription but for the last 5 days she has been taking her grandmothers prescribed medications. Denies SI/HI.  ? ? ?

## 2022-03-25 NOTE — ED Notes (Signed)
Patient states she has been having bad anxiety and panic attacks that started after she lost a pregnancy at 28 weeks. Patient states she was prescribed Zoloft and xanax in Nov 2022 but did not get it filled and was trying to work through it herself without medications.  Patient states the past couple weeks it was been getting worse and started taking Zoloft 25 mg a few days ago due to having an anxiety attack at her grandmothers home. Patient states she went to doctor today and ended up having a positive pregnancy test and was told to stop the Zoloft. Patient is tearful and speech is rapid. Patient denies SI/HI/AVH. Patient states she just wants help with how she is feeling.   ?

## 2022-03-25 NOTE — ED Notes (Signed)
First nurse note- pt slid down to floor in lobby and placed in a wheelchair.  Pt did not fall.   Pt reports she has nausea and anxiety.  Pt given a hand fan and sitting by first nurse.    ?

## 2022-03-25 NOTE — ED Notes (Signed)
Discharge paperwork reviewed with patient. Patient verbalized understanding and signed Discharge paper.  ?

## 2022-03-26 DIAGNOSIS — F410 Panic disorder [episodic paroxysmal anxiety] without agoraphobia: Secondary | ICD-10-CM | POA: Diagnosis present

## 2022-03-26 DIAGNOSIS — F332 Major depressive disorder, recurrent severe without psychotic features: Secondary | ICD-10-CM | POA: Diagnosis present

## 2022-03-28 LAB — URINE CULTURE: Culture: >=100000 — AB

## 2022-04-03 ENCOUNTER — Telehealth: Payer: Self-pay | Admitting: Family Medicine

## 2022-04-03 NOTE — Telephone Encounter (Signed)
Called patient to set up New OB intake & New OB.Lindsey KitchenMarland KitchenReceived [facility]. ?
